# Patient Record
Sex: Female | Born: 1995
Health system: Southern US, Community
[De-identification: ages and names within clinical notes are randomized; demographics above are authoritative.]

## PROBLEM LIST (undated history)

## (undated) DIAGNOSIS — N189 Chronic kidney disease, unspecified: Secondary | ICD-10-CM

## (undated) DIAGNOSIS — B9689 Other specified bacterial agents as the cause of diseases classified elsewhere: Secondary | ICD-10-CM

## (undated) DIAGNOSIS — R55 Syncope and collapse: Secondary | ICD-10-CM

## (undated) HISTORY — PX: NO PAST SURGERIES: SHX2092

---
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
## 2016-02-26 DIAGNOSIS — Z8249 Family history of ischemic heart disease and other diseases of the circulatory system: Secondary | ICD-10-CM | POA: Insufficient documentation

## 2019-09-27 ENCOUNTER — Encounter: Payer: Self-pay | Admitting: Family Medicine

## 2019-09-27 ENCOUNTER — Other Ambulatory Visit: Payer: Self-pay

## 2019-09-27 ENCOUNTER — Ambulatory Visit (INDEPENDENT_AMBULATORY_CARE_PROVIDER_SITE_OTHER): Payer: 59 | Admitting: Family Medicine

## 2019-09-27 VITALS — BP 110/76 | HR 51 | Temp 97.6°F | Resp 14 | Ht 62.0 in | Wt 133.8 lb

## 2019-09-27 DIAGNOSIS — Z3041 Encounter for surveillance of contraceptive pills: Secondary | ICD-10-CM

## 2019-09-27 DIAGNOSIS — Z0000 Encounter for general adult medical examination without abnormal findings: Secondary | ICD-10-CM

## 2019-09-27 DIAGNOSIS — R55 Syncope and collapse: Secondary | ICD-10-CM

## 2019-09-27 DIAGNOSIS — N760 Acute vaginitis: Secondary | ICD-10-CM | POA: Diagnosis not present

## 2019-09-27 DIAGNOSIS — B9689 Other specified bacterial agents as the cause of diseases classified elsewhere: Secondary | ICD-10-CM

## 2019-09-27 HISTORY — DX: Syncope and collapse: R55

## 2019-09-27 HISTORY — DX: Other specified bacterial agents as the cause of diseases classified elsewhere: N76.0

## 2019-09-27 HISTORY — DX: Other specified bacterial agents as the cause of diseases classified elsewhere: B96.89

## 2019-09-27 LAB — CBC WITH DIFFERENTIAL/PLATELET
Basophils Absolute: 0.1 10*3/uL (ref 0.0–0.1)
Basophils Relative: 0.8 % (ref 0.0–3.0)
Eosinophils Absolute: 0.1 10*3/uL (ref 0.0–0.7)
Eosinophils Relative: 1.4 % (ref 0.0–5.0)
HCT: 42 % (ref 36.0–46.0)
Hemoglobin: 14.1 g/dL (ref 12.0–15.0)
Lymphocytes Relative: 30.6 % (ref 12.0–46.0)
Lymphs Abs: 2 10*3/uL (ref 0.7–4.0)
MCHC: 33.5 g/dL (ref 30.0–36.0)
MCV: 97.4 fl (ref 78.0–100.0)
Monocytes Absolute: 0.4 10*3/uL (ref 0.1–1.0)
Monocytes Relative: 6.3 % (ref 3.0–12.0)
Neutro Abs: 4 10*3/uL (ref 1.4–7.7)
Neutrophils Relative %: 60.9 % (ref 43.0–77.0)
Platelets: 266 10*3/uL (ref 150.0–400.0)
RBC: 4.31 Mil/uL (ref 3.87–5.11)
RDW: 13.8 % (ref 11.5–15.5)
WBC: 6.5 10*3/uL (ref 4.0–10.5)

## 2019-09-27 LAB — COMPREHENSIVE METABOLIC PANEL
ALT: 13 U/L (ref 0–35)
AST: 16 U/L (ref 0–37)
Albumin: 4.3 g/dL (ref 3.5–5.2)
Alkaline Phosphatase: 46 U/L (ref 39–117)
BUN: 12 mg/dL (ref 6–23)
CO2: 26 mEq/L (ref 19–32)
Calcium: 9.4 mg/dL (ref 8.4–10.5)
Chloride: 103 mEq/L (ref 96–112)
Creatinine, Ser: 0.84 mg/dL (ref 0.40–1.20)
GFR: 82.17 mL/min (ref >60.00)
Glucose, Bld: 91 mg/dL (ref 70–99)
Potassium: 3.9 mEq/L (ref 3.5–5.1)
Sodium: 138 mEq/L (ref 135–145)
Total Bilirubin: 0.4 mg/dL (ref 0.2–1.2)
Total Protein: 7 g/dL (ref 6.0–8.3)

## 2019-09-27 LAB — LIPID PANEL
Cholesterol: 143 mg/dL (ref 0–200)
HDL: 71.5 mg/dL (ref >39.00)
LDL Cholesterol: 56 mg/dL (ref 0–99)
NonHDL: 71.89
Total CHOL/HDL Ratio: 2
Triglycerides: 80 mg/dL (ref 0.0–149.0)
VLDL: 16 mg/dL (ref 0.0–40.0)

## 2019-09-27 LAB — TSH: TSH: 2.23 u[IU]/mL (ref 0.35–4.50)

## 2019-09-27 NOTE — Patient Instructions (Addendum)
Please return in 12 months for your annual complete physical; please come fasting.  Please sign up for mychart. I have texted you the link.  I will release your lab results to you on your MyChart account with further instructions. Please reply with any questions.    It was a pleasure meeting you today! Thank you for choosing [place] to meet your healthcare needs! I truly look forward to working with you. If you have any questions or concerns, please send me a message via Mychart or call the office at [phone].  Please do these things to maintain good health!   Exercise at least 30-45 minutes a day,  4-5 days a week.   Eat a low-fat diet with lots of fruits and vegetables, up to 7-9 servings per day.  Drink plenty of water daily. Try to drink 8 8oz glasses per day.  Seatbelts can save your life. Always wear your seatbelt.  Place Smoke Detectors on every level of your home and check batteries every year.  Schedule an appointment with an eye doctor for an eye exam every 1-2 years  Safe sex - use condoms to protect yourself from STDs if you could be exposed to these types of infections. Use birth control if you do not want to become pregnant and are sexually active.  Avoid heavy alcohol use. If you drink, keep it to less than 2 drinks/day and not every day.  [place].  Choose someone you trust that could speak for you if you became unable to speak for yourself.  Depression is common in our stressful world.If you're feeling down or losing interest in things you normally enjoy, please come in for a visit.  If anyone is threatening or hurting you, please get help. Physical or Emotional Violence is never OK.

## 2019-09-27 NOTE — Progress Notes (Signed)
Subjective  Chief Complaint  Patient presents with  . Establish Care    Has not had PCP, OB/GYN Eveline Keto saw 06/2019  . Annual Exam    HPI: Carol Walters is a 24 y.o. female who presents to [facility] [place] today for a Female Wellness Visit.   Wellness Visit: annual visit with health maintenance review and exam without Pap   23 year old female here for complete physical and to establish care.  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  She is doing well overall but very busy.  She is also trying to obtain her BSN with online schooling at night.  She is single, intermittently sexually active, has a history of recurrent bacterial vaginosis treated by her GYN.  She is also on oral contraceptives without adverse effects.  She has regular menstrual cycles.  Overall she is healthy and feels well.  She reports a history of near syncope and palpitations and has been worked up by cardiology in the past.  She reports a normal echocardiogram, stress test and EKG.  At times she will have very intermittent symptoms of feeling lightheaded or short of breath but they are rare and have not progressed to anything further.  She says she is never been put on a Holter monitor for any prolonged period of time.  She will be getting her flu shot this week at work.  She believes her Tdap is up-to-date.  Cervical cancer screening is normal and up-to-date.  Assessment  1. Annual physical exam   2. Oral contraceptive use   3. Bacterial vaginosis   4. Near syncope      Plan  Female Wellness Visit:  Age appropriate Health Maintenance and Prevention measures were discussed with patient. Included topics are cancer screening recommendations, ways to keep healthy (see AVS) including dietary and exercise recommendations, regular eye and dental care, use of seat belts, and avoidance of moderate alcohol use and tobacco use.   BMI: discussed patient's BMI and encouraged positive  lifestyle modifications to help get to or maintain a target BMI.  HM needs and immunizations were addressed and ordered. See below for orders. See HM and immunization section for updates.  Routine labs and screening tests ordered including cmp, cbc and lipids where appropriate.  Discussed recommendations regarding Vit D and calcium supplementation (see AVS)  She will follow-up with GYN for contraceptive use and recurrent bacterial vaginosis.  She is asymptomatic at this time.  Near syncope and shortness of breath: Reviewed cardiology notes.  Normal stress echocardiogram, normal EKG.  Likely partially vasovagal in nature.  Recheck lab work today.  Follow-up if symptoms worsen or progress.  Follow up: Return in about 1 year (around 09/26/2020) for complete physical.   Orders Placed This Encounter  Procedures  . CBC with Differential/Platelet  . Comprehensive metabolic panel  . Lipid panel  . HIV Antibody (routine testing w rflx)  . TSH   No orders of the defined types were placed in this encounter.     Lifestyle: Body mass index is 24.47 kg/m. Wt Readings from Last 3 Encounters:  09/27/19 133 lb 12.8 oz (60.7 kg)   Diet: low fat Exercise: frequently,  Need for contraception: Yes, OCP (estrogen/progesterone)  Patient Active Problem List   Diagnosis Date Noted  . Oral contraceptive use 09/27/2019  . Bacterial vaginosis - recurrent BV 09/27/2019  . Near syncope 09/27/2019    Negative cardiac workup    Health Maintenance  Topic Date  Due  . HIV Screening  01/26/2009  . TETANUS/TDAP  01/26/2013  . PAP-Cervical Cytology Screening  01/26/2015  . PAP SMEAR-Modifier  09/26/2021  . INFLUENZA VACCINE  Completed   Immunization History  Administered Date(s) Administered  . Influenza,inj,Quad PF,6+ Mos 10/13/2018  . PPD Test 03/07/2016, 02/22/2017   We updated and reviewed the patient's past history in detail and it is documented below. Allergies: Patient is allergic to  amoxicillin. Past Medical History Patient  has a past medical history of Bacterial vaginosis - recurrent BV (09/27/2019) and Near syncope (09/27/2019). Past Surgical History Patient  has no past surgical history on file. Family History: Patient family history is not on file. Social History:  Patient  reports that she has never smoked. She has never used smokeless tobacco.  Review of Systems: Constitutional: negative for fever or malaise Ophthalmic: negative for photophobia, double vision or loss of vision Cardiovascular: negative for chest pain, dyspnea on exertion, or new LE swelling Respiratory: negative for  persistent cough, positive for intermittent shortness of breath. Gastrointestinal: negative for abdominal pain, change in bowel habits or melena Genitourinary: negative for dysuria or gross hematuria, no abnormal uterine bleeding or disharge Musculoskeletal: negative for new gait disturbance or muscular weakness Integumentary: negative for new or persistent rashes, no breast lumps Neurological: negative for TIA or stroke symptoms Psychiatric: negative for SI or delusions Allergic/Immunologic: negative for hives Patient Care Team    Relationship Specialty Notifications Start End  Leamon Arnt, MD PCP - General Family Medicine  09/27/19   Elesa Massed, NP Nurse Practitioner Obstetrics and Gynecology  09/27/19     Objective  Vitals: BP 110/76   Pulse (!) 51   Temp 97.6 F (36.4 C) (Tympanic)   Resp 14   Ht 5\' 2"  (1.575 m)   Wt 133 lb 12.8 oz (60.7 kg)   LMP 09/18/2019   SpO2 98%   BMI 24.47 kg/m  General:  Well developed, well nourished, no acute distress  Psych:  Alert and orientedx3,normal mood and affect HEENT:  Normocephalic, atraumatic, non-icteric sclera, PERRL, oropharynx is clear without mass or exudate, supple neck without adenopathy, mass or thyromegaly Cardiovascular:  Normal S1, S2, RRR without gallop, rub or murmur, nondisplaced PMI Respiratory:  Good  breath sounds bilaterally, CTAB with normal respiratory effort Gastrointestinal: normal bowel sounds, soft, non-tender, no noted masses. No HSM MSK: no deformities, contusions. Joints are without erythema or swelling. Spine and CVA region are nontender Skin:  Warm, no rashes or suspicious lesions noted Neurologic:    Mental status is normal. CN 2-11 are normal. Gross motor and sensory exams are normal. Normal gait. No tremor    Commons side effects, risks, benefits, and alternatives for medications and treatment plan prescribed today were discussed, and the patient expressed understanding of the given instructions. Patient is instructed to call or message via MyChart if he/she has any questions or concerns regarding our treatment plan. No barriers to understanding were identified. We discussed Red Flag symptoms and signs in detail. Patient expressed understanding regarding what to do in case of urgent or emergency type symptoms.   Medication list was reconciled, printed and provided to the patient in AVS. Patient instructions and summary information was reviewed with the patient as documented in the AVS. This note was prepared with assistance of Dragon voice recognition software. Occasional wrong-word or sound-a-like substitutions may have occurred due to the inherent limitations of voice recognition software

## 2019-09-28 ENCOUNTER — Encounter: Payer: Self-pay | Admitting: *Deleted

## 2019-09-28 ENCOUNTER — Encounter: Payer: Self-pay | Admitting: Family Medicine

## 2019-09-28 LAB — HIV ANTIBODY (ROUTINE TESTING W REFLEX): HIV 1&2 Ab, 4th Generation: NONREACTIVE

## 2019-09-28 NOTE — Progress Notes (Signed)
Lab results mailed to patient in letter. Normal results. No action / follow up needed on these results.  

## 2020-07-17 ENCOUNTER — Other Ambulatory Visit: Payer: Self-pay

## 2020-07-17 ENCOUNTER — Emergency Department (INDEPENDENT_AMBULATORY_CARE_PROVIDER_SITE_OTHER): Payer: No Typology Code available for payment source

## 2020-07-17 ENCOUNTER — Emergency Department
Admission: RE | Admit: 2020-07-17 | Discharge: 2020-07-17 | Disposition: A | Discharge status: Home / Self Care | Payer: No Typology Code available for payment source | Source: Ambulatory Visit | Attending: Family Medicine | Admitting: Family Medicine

## 2020-07-17 ENCOUNTER — Emergency Department: Admit: 2020-07-17 | Discharge status: Absent without leave | Payer: Self-pay

## 2020-07-17 VITALS — BP 115/85 | HR 89 | Temp 99.4°F | Resp 16

## 2020-07-17 DIAGNOSIS — R1084 Generalized abdominal pain: Secondary | ICD-10-CM | POA: Diagnosis not present

## 2020-07-17 DIAGNOSIS — J209 Acute bronchitis, unspecified: Secondary | ICD-10-CM | POA: Diagnosis not present

## 2020-07-17 DIAGNOSIS — K5900 Constipation, unspecified: Secondary | ICD-10-CM

## 2020-07-17 DIAGNOSIS — R109 Unspecified abdominal pain: Secondary | ICD-10-CM | POA: Diagnosis not present

## 2020-07-17 LAB — POCT URINALYSIS DIP (MANUAL ENTRY)
Bilirubin, UA: NEGATIVE
Blood, UA: NEGATIVE
Glucose, UA: NEGATIVE mg/dL
Ketones, POC UA: NEGATIVE mg/dL
Leukocytes, UA: NEGATIVE
Nitrite, UA: NEGATIVE
Protein Ur, POC: NEGATIVE mg/dL
Spec Grav, UA: 1.025 (ref 1.010–1.025)
Urobilinogen, UA: 0.2 E.U./dL
pH, UA: 7 (ref 5.0–8.0)

## 2020-07-17 LAB — POCT CBC W AUTO DIFF (K'VILLE URGENT CARE)

## 2020-07-17 LAB — POCT URINE PREGNANCY: Preg Test, Ur: NEGATIVE

## 2020-07-17 MED ORDER — AZITHROMYCIN 250 MG PO TABS: ORAL_TABLET | ORAL | 0 refills | Status: DC

## 2020-07-17 NOTE — ED Triage Notes (Signed)
Patient presents to Urgent Care with complaints of generalized abdominal pain since late last night. Patient reports sometimes the pain is upper/centralized and then feels like it radiates to the rest of her abdomen, pain is intermittent, has tried tums but it does not seem to help. Pt is having regular BMs, had one earlier today.  Patient saw her OBGYN yesterday, states no pain at that time. Pt also has a persistent dry cough, worse at night.

## 2020-07-17 NOTE — ED Provider Notes (Signed)
[facility]    CSN: [id number] Arrival date & time: 07/17/20  1831      History   Chief Complaint Chief Complaint  Patient presents with  . Appointment    7:00  . Abdominal Pain  . Cough    HPI Carol Walters is a 24 y.o. female.   Patient complains of a non productive cough for one week, poorly controlled with Delsym. She denies pleuritic pain, shortness of breath, and fevers, chills, and sweats.  Yesterday she developed intermittent abdominal cramps/bloating.  The pain is in the upper central part of her abdomen periodically involving her whole abdomen.  She denies nausea/vomiting and Tums have not been helpful.  Her bowel movements have been normal.     The history is provided by the patient.    Past Medical History:  Diagnosis Date  . Bacterial vaginosis - recurrent BV 09/27/2019  . Near syncope 09/27/2019   Negative cardiac workup    Patient Active Problem List   Diagnosis Date Noted  . Oral contraceptive use 09/27/2019  . Bacterial vaginosis - recurrent BV 09/27/2019  . Near syncope 09/27/2019    History reviewed. No pertinent surgical history.  OB History   No obstetric history on file.      Home Medications    Prior to Admission medications   Medication Sig Start Date End Date Taking? Authorizing Provider  azithromycin (ZITHROMAX Z-PAK) 250 MG tablet Take 2 tabs today; then begin one tab once daily for 4 more days. 07/17/20   Lattie Haw, MD  norgestimate-ethinyl estradiol (SPRINTEC 28) 0.25-35 MG-MCG tablet Sprintec (28) 0.25 mg-35 mcg tablet  TAKE 1 TABLET BY MOUTH EVERY DAY AS DIRECTED    [provider]    Family History Family History  Problem Relation Age of Onset  . Healthy Mother   . Diabetes Maternal Grandmother   . Hearing loss Maternal Grandmother   . Cancer Maternal Grandfather   . Hearing loss Maternal Grandfather   . High blood pressure Maternal Grandfather   . Stroke Paternal Grandmother   . High blood  pressure Paternal Grandfather   . Stroke Paternal Grandfather   . Healthy Father     Social History Social History   Tobacco Use  . Smoking status: Never Smoker  . Smokeless tobacco: Never Used  Substance Use Topics  . Alcohol use: Yes    Comment: occ  . Drug use: Not on file     Allergies   Amoxicillin   Review of Systems Review of Systems No sore throat + cough No pleuritic pain No wheezing No nasal congestion No post-nasal drainage No sinus pain/pressure No itchy/red eyes No earache No hemoptysis No SOB No fever/chills No nausea No vomiting + abdominal pain No diarrhea No urinary symptoms No skin rash No fatigue No myalgias No headache   Physical Exam Triage Vital Signs ED Triage Vitals  Enc Vitals Group     BP 07/17/20 1850 115/85     Pulse Rate 07/17/20 1850 89     Resp 07/17/20 1850 16     Temp 07/17/20 1850 99.4 F (37.4 C)     Temp Source 07/17/20 1850 Oral     SpO2 07/17/20 1850 99 %     Weight --      Height --      Head Circumference --      Peak Flow --      Pain Score 07/17/20 1847 4     Pain Loc --  Pain Edu? --      Excl. in GC? --    No data found.  Updated Vital Signs BP 115/85 (BP Location: Right Arm)   Pulse 89   Temp 99.4 F (37.4 C) (Oral)   Resp 16   SpO2 99%   Visual Acuity Right Eye Distance:   Left Eye Distance:   Bilateral Distance:    Right Eye Near:   Left Eye Near:    Bilateral Near:     Physical Exam Vitals and nursing note reviewed.  Constitutional:      General: She is not in acute distress.    Appearance: She is not ill-appearing.  HENT:     Head: Normocephalic.     Right Ear: Tympanic membrane normal.     Left Ear: Tympanic membrane normal.     Nose: Nose normal.     Mouth/Throat:     Pharynx: Oropharynx is clear.  Eyes:     Extraocular Movements: Extraocular movements intact.     Conjunctiva/sclera: Conjunctivae normal.     Pupils: Pupils are equal, round, and reactive to light.    Cardiovascular:     Rate and Rhythm: Normal rate.     Heart sounds: Normal heart sounds.  Pulmonary:     Breath sounds: Normal breath sounds.  Abdominal:     General: Bowel sounds are normal.     Palpations: Abdomen is soft.     Tenderness: There is abdominal tenderness in the right lower quadrant and periumbilical area.       Comments: Abdomen has vague diffuse tenderness to palpation most pronounced in there right lower quadrant.  Musculoskeletal:        General: No tenderness.     Cervical back: Neck supple.     Right lower leg: No edema.     Left lower leg: No edema.  Lymphadenopathy:     Cervical: No cervical adenopathy.  Skin:    General: Skin is warm and dry.     Findings: No rash.  Neurological:     Mental Status: She is alert and oriented to person, place, and time.      UC Treatments / Results  Labs (all labs ordered are listed, but only abnormal results are displayed) Labs Reviewed  POCT URINALYSIS DIP (MANUAL ENTRY) - Abnormal; Notable for the following components:      Result Value   Clarity, UA cloudy (*)    All other components within normal limits  TSH  POCT CBC W AUTO DIFF ([facility]):  WBC 11.1; LY 12.3; MO 8.5; GR 79.2; Hgb 15.2; Platelets 315   POCT URINE PREGNANCY negative    EKG   Radiology DG Abd 2 Views  Result Date: 07/17/2020 CLINICAL DATA:  Generalized abdominal pain for 2 days EXAM: ABDOMEN - 2 VIEW COMPARISON:  02/05/2010 FINDINGS: Mild S-shaped scoliosis of the thoracolumbar spine is noted. Scattered large and small bowel gas is noted. Mild retained fecal material is noted consistent with mild constipation. No obstructive changes are seen. No calcifications are noted. IMPRESSION: Mild constipation. Electronically Signed   By: Alcide Clever M.D.   On: 07/17/2020 20:16    Procedures Procedures (including critical care time)  Medications Ordered in UC Medications - No data to display  Initial Impression / Assessment and  Plan / UC Course  I have reviewed the triage vital signs and the nursing notes.  Pertinent labs & imaging results that were available during my care of the patient were reviewed by  me and considered in my medical decision making (see chart for details).    Suspect mild leukocytosis resulting from respiratory condition.  Begin empiric Z-pak. Abdominal pain resulting from constipation.  Will check TSH. Followup with Family Doctor if not improved in one week.    Final Clinical Impressions(s) / UC Diagnoses   Final diagnoses:  Generalized abdominal pain  Abdominal pain  Constipation, unspecified constipation type  Acute bronchitis, unspecified organism     Discharge Instructions     Recommend taking daily Miralax, approximately 1/2 to one capful mixed in 4 to 8 ounces of water until bowel movements are regular.    May take Delsym Cough Suppressant ("12 Hour Cough Relief") at bedtime for nighttime cough.      ED Prescriptions    Medication Sig Dispense Auth. Provider   azithromycin (ZITHROMAX Z-PAK) 250 MG tablet Take 2 tabs today; then begin one tab once daily for 4 more days. 6 tablet Lattie Haw, MD        Lattie Haw, MD 07/19/20 [phone]

## 2020-07-17 NOTE — Discharge Instructions (Addendum)
Recommend taking daily Miralax, approximately 1/2 to one capful mixed in 4 to 8 ounces of water until bowel movements are regular.    May take Delsym Cough Suppressant ("12 Hour Cough Relief") at bedtime for nighttime cough.

## 2020-07-18 LAB — TSH: TSH: 1.37 mIU/L

## 2020-07-18 MED FILL — AZITHROMYCIN 250 MG TABS: 250 | 5 days supply | Qty: 6 | Fill #0

## 2020-08-14 IMAGING — DX DG ABDOMEN 2V
2 series · 2 of 2 positions shown · non-contrast
Comparison: 02/05/2010

CLINICAL DATA: Generalized abdominal pain for 2 days

EXAM:
ABDOMEN - 2 VIEW

[abdomen erect]
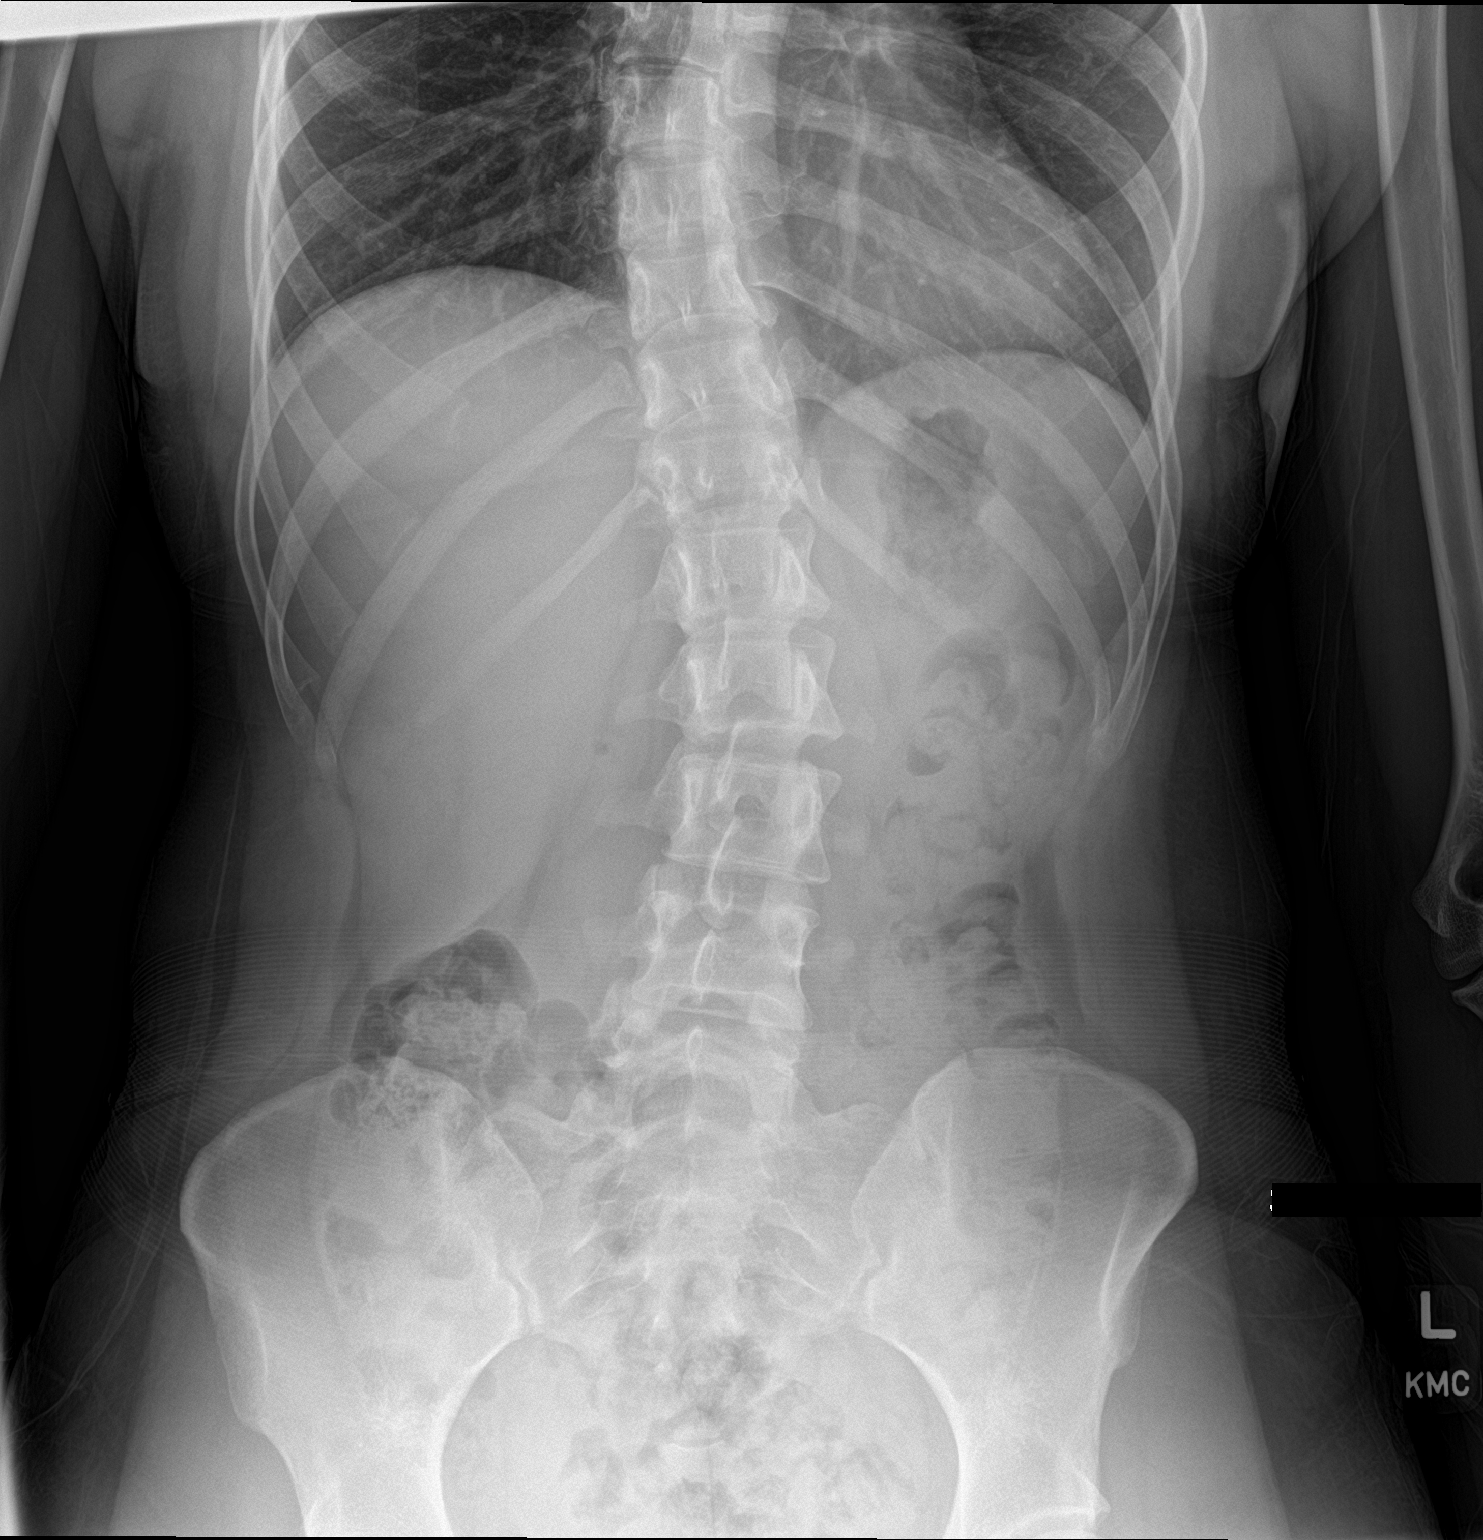

[abdomen supine]
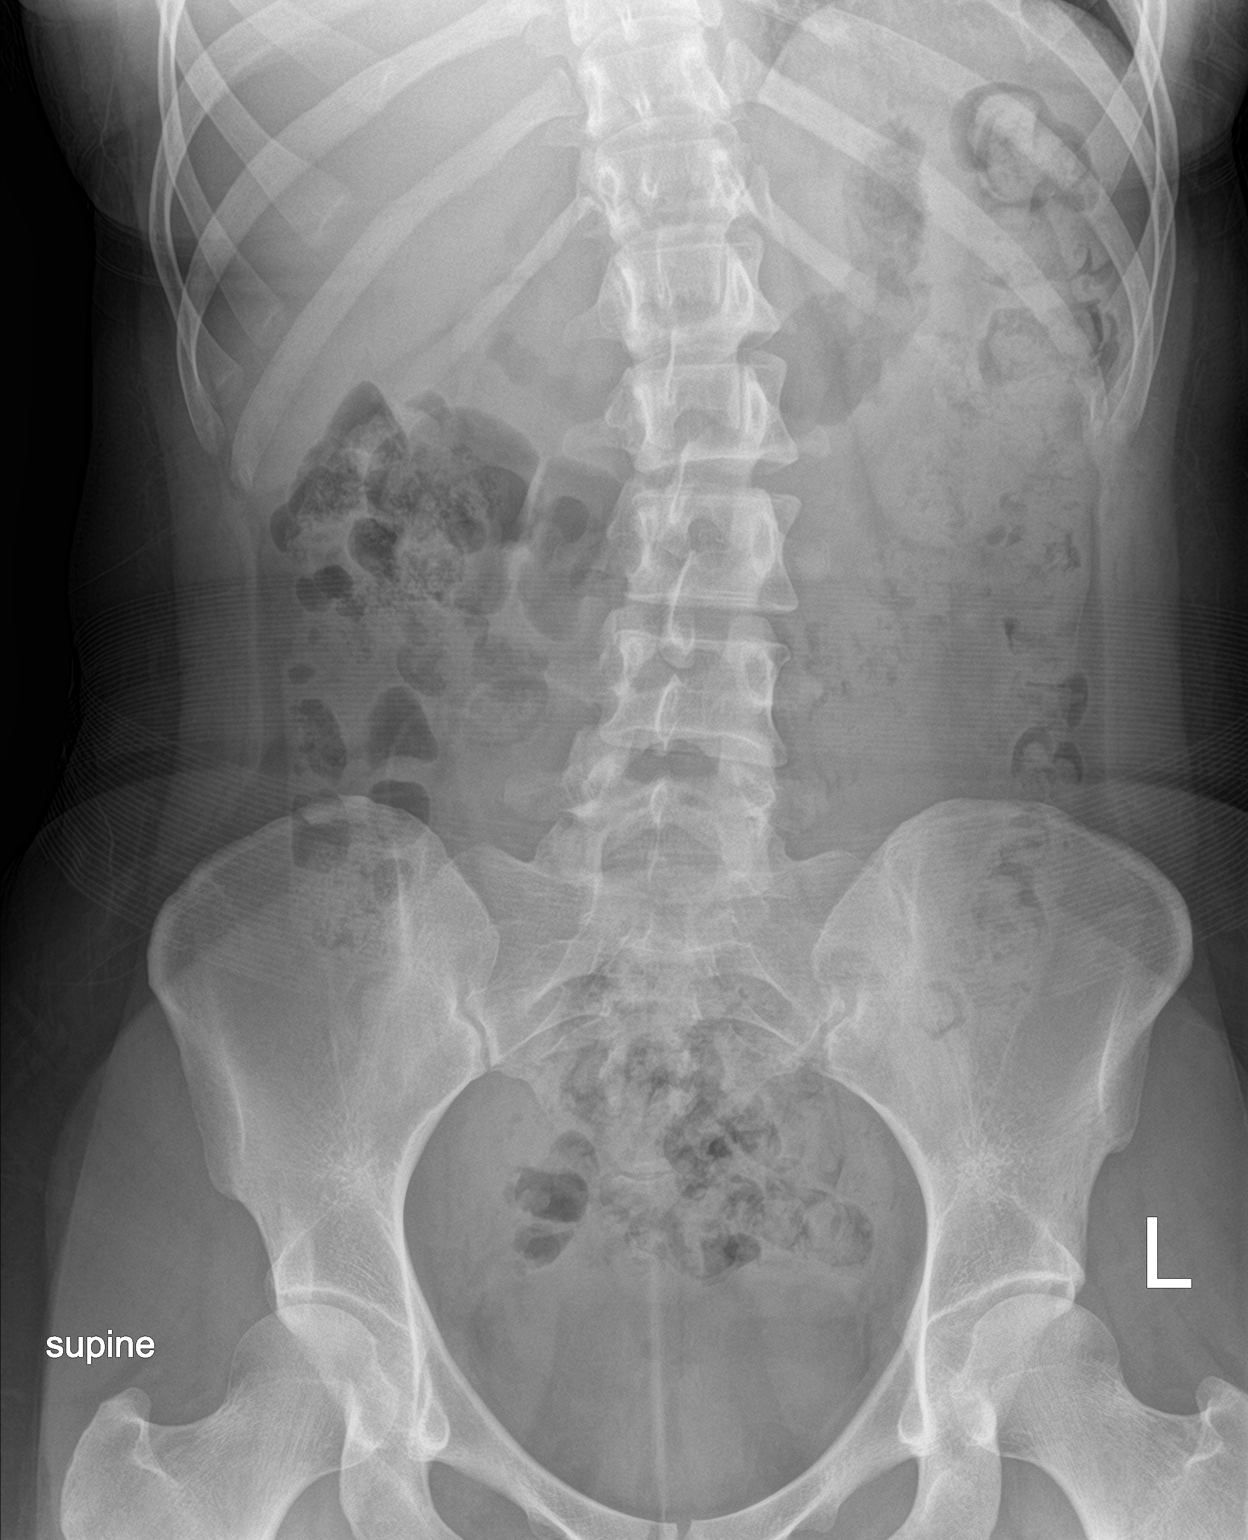

[2 of 2 positions shown; findings below may reference images not displayed]

FINDINGS: Mild S-shaped scoliosis of the thoracolumbar spine is noted.
Scattered large and small bowel gas is noted. Mild retained fecal
material is noted consistent with mild constipation. No obstructive
changes are seen. No calcifications are noted.
IMPRESSION: Mild constipation.

## 2021-07-23 ENCOUNTER — Emergency Department (HOSPITAL_BASED_OUTPATIENT_CLINIC_OR_DEPARTMENT_OTHER)
Admission: EM | Admit: 2021-07-23 | Discharge: 2021-07-23 | Disposition: A | Discharge status: Home / Self Care | Payer: No Typology Code available for payment source | Attending: Emergency Medicine | Admitting: Emergency Medicine

## 2021-07-23 ENCOUNTER — Other Ambulatory Visit: Payer: Self-pay

## 2021-07-23 ENCOUNTER — Emergency Department (HOSPITAL_BASED_OUTPATIENT_CLINIC_OR_DEPARTMENT_OTHER): Payer: No Typology Code available for payment source

## 2021-07-23 ENCOUNTER — Other Ambulatory Visit (HOSPITAL_COMMUNITY): Payer: Self-pay

## 2021-07-23 ENCOUNTER — Encounter (HOSPITAL_BASED_OUTPATIENT_CLINIC_OR_DEPARTMENT_OTHER): Payer: Self-pay | Admitting: Emergency Medicine

## 2021-07-23 DIAGNOSIS — R1030 Lower abdominal pain, unspecified: Secondary | ICD-10-CM | POA: Diagnosis present

## 2021-07-23 DIAGNOSIS — R1031 Right lower quadrant pain: Secondary | ICD-10-CM | POA: Insufficient documentation

## 2021-07-23 LAB — URINALYSIS, ROUTINE W REFLEX MICROSCOPIC
Bilirubin Urine: NEGATIVE
Glucose, UA: NEGATIVE mg/dL
Hgb urine dipstick: NEGATIVE
Ketones, ur: NEGATIVE mg/dL
Leukocytes,Ua: NEGATIVE
Nitrite: NEGATIVE
Specific Gravity, Urine: 1.024 (ref 1.005–1.030)
pH: 7 (ref 5.0–8.0)

## 2021-07-23 LAB — WET PREP, GENITAL
Clue Cells Wet Prep HPF POC: NONE SEEN
Sperm: NONE SEEN
Trich, Wet Prep: NONE SEEN
Yeast Wet Prep HPF POC: NONE SEEN

## 2021-07-23 LAB — COMPREHENSIVE METABOLIC PANEL
ALT: 13 U/L (ref 0–44)
AST: 15 U/L (ref 15–41)
Albumin: 4.2 g/dL (ref 3.5–5.0)
Alkaline Phosphatase: 43 U/L (ref 38–126)
Anion gap: 11 (ref 5–15)
BUN: 15 mg/dL (ref 6–20)
CO2: 28 mmol/L (ref 22–32)
Calcium: 9.6 mg/dL (ref 8.9–10.3)
Chloride: 101 mmol/L (ref 98–111)
Creatinine, Ser: 0.84 mg/dL (ref 0.44–1.00)
GFR, Estimated: >60 mL/min (ref >60)
Glucose, Bld: 81 mg/dL (ref 70–99)
Potassium: 3.4 mmol/L — ABNORMAL LOW (ref 3.5–5.1)
Sodium: 140 mmol/L (ref 135–145)
Total Bilirubin: 0.4 mg/dL (ref 0.3–1.2)
Total Protein: 7.6 g/dL (ref 6.5–8.1)

## 2021-07-23 LAB — PREGNANCY, URINE: Preg Test, Ur: NEGATIVE

## 2021-07-23 LAB — CBC
HCT: 44.5 % (ref 36.0–46.0)
Hemoglobin: 15 g/dL (ref 12.0–15.0)
MCH: 31.8 pg (ref 26.0–34.0)
MCHC: 33.7 g/dL (ref 30.0–36.0)
MCV: 94.3 fL (ref 80.0–100.0)
Platelets: 308 10*3/uL (ref 150–400)
RBC: 4.72 MIL/uL (ref 3.87–5.11)
RDW: 13.1 % (ref 11.5–15.5)
WBC: 7.9 10*3/uL (ref 4.0–10.5)
nRBC: 0 % (ref 0.0–0.2)

## 2021-07-23 LAB — LIPASE, BLOOD: Lipase: 18 U/L (ref 11–51)

## 2021-07-23 MED ORDER — PANTOPRAZOLE SODIUM 20 MG PO TBEC
40.0000 mg | DELAYED_RELEASE_TABLET | Freq: Every day | ORAL | 0 refills | Status: DC
  Filled 2021-07-23: qty 28, 14d supply, fill #0

## 2021-07-23 MED ORDER — ONDANSETRON 4 MG PO TBDP
4.0000 mg | ORAL_TABLET | Freq: Three times a day (TID) | ORAL | 0 refills | Status: AC | PRN
  Filled 2021-07-23: qty 10, 4d supply, fill #0

## 2021-07-23 MED ORDER — DICYCLOMINE HCL 20 MG PO TABS
20.0000 mg | ORAL_TABLET | Freq: Three times a day (TID) | ORAL | 0 refills | Status: AC | PRN
  Filled 2021-07-23: qty 20, 7d supply, fill #0

## 2021-07-23 MED ORDER — IOHEXOL 300 MG/ML  SOLN
80.0000 mL | Freq: Once | INTRAMUSCULAR | Status: AC | PRN
  Administered 2021-07-23: 80 mL via INTRAVENOUS

## 2021-07-23 NOTE — ED Notes (Signed)
ED Provider at bedside. 

## 2021-07-23 NOTE — ED Triage Notes (Signed)
Pt complains of generalized abdominal pain that started on the 28th. Pain is intermittent. Pt is unsure of cause.  Pt was born with one kidney. Pt currently complains of 2 out of 10 but stated that when she first got her she was a 5 out of 10.

## 2021-07-23 NOTE — ED Provider Notes (Signed)
[facility] EMERGENCY DEPT Provider Note   CSN: [id number] Arrival date & time: 07/23/21  0908     History Chief Complaint  Patient presents with   Abdominal Pain    Carol Walters is a 25 y.o. female.  HPI     25yo female presents with concern for abdominal pain.  Reports for about one week has been having abdominal pain. 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  She ate sushi the first night and began to have some pain that she thought was due to the food.  The pain continued but was mild initially. Did have some etoh at the wedding, pain has now continued since that time, epigastric pain with radiating to the RUQ and RLQ. It waxes and wanes but is not associated with anything in particular, not changed by eating or drinking. Goes from 3/10 to 7/10 in severity. Worsened last night and more persistent so came to ED.  No dysuria, vaginal discharge, fevers, chills, hx of abdominal surgery. Has irregular menses that were wupposed to start Monday but not unusual to be irregular and took neg pregnancy test yesterday. Sexually active with partner, on OCP but not always using condoms. Has had abdominal pain before diagnosed as constipation but had a BM yesterday on the looser side.   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   Past Medical History:  Diagnosis Date   Bacterial vaginosis - recurrent BV 09/27/2019   Near syncope 09/27/2019   Negative cardiac workup    Patient Active Problem List   Diagnosis Date Noted   Oral contraceptive use 09/27/2019   Bacterial vaginosis - recurrent BV 09/27/2019   Near syncope 09/27/2019    History reviewed. No pertinent surgical history.   OB History   No obstetric history on file.     Family History  Problem Relation Age of Onset   Healthy Mother    Diabetes Maternal Grandmother    Hearing loss Maternal Grandmother    Cancer Maternal Grandfather    Hearing loss Maternal Grandfather    High blood pressure Maternal Grandfather     Stroke Paternal Grandmother    High blood pressure Paternal Grandfather    Stroke Paternal Grandfather    Healthy Father     Social History   Tobacco Use   Smoking status: Never   Smokeless tobacco: Never  Substance Use Topics   Alcohol use: Yes    Comment: occ    Home Medications Prior to Admission medications   Medication Sig Start Date End Date Taking? Authorizing Provider  azithromycin (ZITHROMAX Z-PAK) 250 MG tablet Take 2 tabs today; then begin one tab once daily for 4 more days. 07/17/20   Lattie Haw, MD  norgestimate-ethinyl estradiol (SPRINTEC 28) 0.25-35 MG-MCG tablet Sprintec (28) 0.25 mg-35 mcg tablet  TAKE 1 TABLET BY MOUTH EVERY DAY AS DIRECTED    [provider]    Allergies    Amoxicillin  Review of Systems   Review of Systems  Constitutional:  Negative for fever.  Eyes:  Negative for visual disturbance.  Respiratory:  Negative for cough and shortness of breath.   Cardiovascular:  Negative for chest pain.  Gastrointestinal:  Positive for abdominal pain and nausea. Negative for constipation, diarrhea and vomiting.  Genitourinary:  Negative for difficulty urinating.  Musculoskeletal:  Negative for back pain and neck pain.  Skin:  Negative for rash.  Neurological:  Negative for syncope and headaches.   Physical Exam Updated Vital Signs BP  131/89 (BP Location: Right Arm)   Pulse 74   Temp 98.6 F (37 C) (Oral)   Resp 16   Ht 5\' 2"  (1.575 m)   Wt 56.7 kg   LMP 06/24/2021 (Approximate)   SpO2 100%   BMI 22.86 kg/m   Physical Exam Vitals and nursing note reviewed.  Constitutional:      General: She is not in acute distress.    Appearance: She is well-developed. She is not diaphoretic.  HENT:     Head: Normocephalic and atraumatic.  Eyes:     Conjunctiva/sclera: Conjunctivae normal.  Cardiovascular:     Rate and Rhythm: Normal rate and regular rhythm.  Pulmonary:     Effort: Pulmonary effort is normal. No respiratory distress.   Abdominal:     General: There is no distension.     Palpations: Abdomen is soft.     Tenderness: There is abdominal tenderness in the right lower quadrant and suprapubic area. There is no right CVA tenderness, left CVA tenderness or guarding. Positive signs include McBurney's sign. Negative signs include Murphy's sign.  Genitourinary:    Labia:        Right: No tenderness.        Left: No tenderness.      Cervix: No cervical motion tenderness or discharge.     Uterus: Not tender.      Adnexa:        Right: No tenderness.         Left: No tenderness.    Musculoskeletal:        General: No tenderness.     Cervical back: Normal range of motion.  Skin:    General: Skin is warm and dry.     Findings: No erythema or rash.  Neurological:     Mental Status: She is alert and oriented to person, place, and time.    ED Results / Procedures / Treatments   Labs (all labs ordered are listed, but only abnormal results are displayed) Labs Reviewed  COMPREHENSIVE METABOLIC PANEL - Abnormal; Notable for the following components:      Result Value   Potassium 3.4 (*)    All other components within normal limits  URINALYSIS, ROUTINE W REFLEX MICROSCOPIC - Abnormal; Notable for the following components:   Protein, ur TRACE (*)    All other components within normal limits  LIPASE, BLOOD  CBC  PREGNANCY, URINE    EKG None  Radiology No results found.  Procedures Procedures   Medications Ordered in ED Medications  iohexol (OMNIPAQUE) 300 MG/ML solution 80 mL (has no administration in time range)    ED Course  I have reviewed the triage vital signs and the nursing notes.  Pertinent labs & imaging results that were available during my care of the patient were reviewed by me and considered in my medical decision making (see chart for details).    MDM Rules/Calculators/A&P                            25yo female presents with concern for abdominal pain. DDx includes appendicitis,  pancreatitis, cholecystitis, pyelonephritis, nephrolithiasis, diverticulitis, PID, ovarian torsion, ectopic pregnancy, and tuboovarian abscess   Labs show no sign of pancreatitis or hepatitis.  Pregnancy test negative. UA without infection.  CT abdomen pelvis shows no acute abnormalities. Discussed adrenal lesion and need for outpatient follow up imaging.  No sing of PID, TOA, torsion by history and exam. Will treat for  possible gastritis and also give bentyl for pain. Patient discharged in stable condition with understanding of reasons to return.     Final Clinical Impression(s) / ED Diagnoses Final diagnoses:  Lower abdominal pain    Rx / DC Orders ED Discharge Orders     None        Alvira Monday, MD 07/23/21 2148

## 2021-07-24 LAB — GC/CHLAMYDIA PROBE AMP (~~LOC~~) NOT AT ARMC
Chlamydia: NEGATIVE
Comment: NEGATIVE
Comment: NORMAL
Neisseria Gonorrhea: NEGATIVE

## 2021-07-28 ENCOUNTER — Other Ambulatory Visit: Payer: Self-pay

## 2021-07-28 ENCOUNTER — Ambulatory Visit (INDEPENDENT_AMBULATORY_CARE_PROVIDER_SITE_OTHER): Payer: No Typology Code available for payment source | Admitting: Family Medicine

## 2021-07-28 ENCOUNTER — Encounter: Payer: Self-pay | Admitting: Family Medicine

## 2021-07-28 VITALS — BP 100/69 | HR 61 | Temp 98.0°F | Ht 62.0 in | Wt 126.2 lb

## 2021-07-28 DIAGNOSIS — R1013 Epigastric pain: Secondary | ICD-10-CM

## 2021-07-28 DIAGNOSIS — E278 Other specified disorders of adrenal gland: Secondary | ICD-10-CM | POA: Diagnosis not present

## 2021-07-28 DIAGNOSIS — N261 Atrophy of kidney (terminal): Secondary | ICD-10-CM | POA: Diagnosis not present

## 2021-07-28 MED ORDER — PANTOPRAZOLE SODIUM 20 MG PO TBEC: 20.0000 mg | DELAYED_RELEASE_TABLET | Freq: Every day | ORAL | 1 refills | Status: AC

## 2021-07-28 NOTE — Progress Notes (Signed)
Subjective  CC:  Chief Complaint  Patient presents with   Abdominal Pain    Seen at [facility] ED 8/3, possible adrenal lesion or gastritis. Given Bentyl and protonix- has helped the pain some and told to f/u with PCP for more outpatient imaging  States that the pain is not as bad as it was     HPI: Carol Walters is a 25 y.o. female who presents to the office today to address the problems listed above in the chief complaint. 25 year old healthy female on birth control pills presents for follow-up after experiencing abdominal pain.  I reviewed emergency room records including lab work, imaging studies and notes.  She was seen on August 3 for persistent abdominal pain for the last 2 days.  She reports that her symptoms started while she was away at a wedding.  She describes upper abdominal pain worse after eating that radiated to the right lower abdomen.  She denies fevers, chills, nausea or vomiting.  Bowel movements have been normal.  She did eat differently due to being away on travel and had some alcohol which is very rare for her.  No history of gastritis, NSAID use.  Prior to that had felt well.  She is started on Protonix 40 mg nightly and this has helped her symptoms.  Now having only minimal pain.  No history of IBS. CT scan incidentally found an adrenal mass on the left.  It has benign features and is 1.9 cm.  She has no symptoms of Cushing disease, pheochromocytoma and no history of hyperkalemia. Renal atrophy and functioning right kidney was also visualized.  Assessment  1. Midepigastric pain   2. Renal atrophy, right   3. Adrenal mass, right (HCC)      Plan  Midepigastric pain: Possible acute gastritis improving with PPI.  Discussed differential.  Lab work was all reviewed and was unremarkable.  Recommend continuation of PPI for 6 to 12 weeks.  Will decrease dose back to 20 mg daily after a 14-day course of 40 daily.  If symptoms recur or she has flares, can then start work-up  for biliary tract disease or EGD if needed.  Patient stands and agrees with care plan. Adrenal incidentaloma: No clinical features of hormone secreting adrenal mass.  We will follow clinically.  Repeat CT scan in 12 months to ensure stability.  Nonfunctioning right kidney: Likely vascular incident early in life.  Noted.  Avoid nephrotoxins.  Follow up: Overdue for complete physical.  Schedule at her convenience Visit date not found  Orders Placed This Encounter  Procedures   CT Abdomen Pelvis W Contrast   Meds ordered this encounter  Medications   pantoprazole (PROTONIX) 20 MG tablet    Sig: Take 1 tablet (20 mg total) by mouth daily.    Dispense:  30 tablet    Refill:  1      I reviewed the patients updated PMH, FH, and SocHx.    Patient Active Problem List   Diagnosis Date Noted   Renal atrophy, right 07/28/2021   Adrenal mass, right (HCC) 07/28/2021   Oral contraceptive use 09/27/2019   Bacterial vaginosis - recurrent BV 09/27/2019   Near syncope 09/27/2019   Current Meds  Medication Sig   dicyclomine (BENTYL) 20 MG tablet Take 1 tablet (20 mg total) by mouth 3 (three) times daily as needed for spasms (abdominal pain).   norgestimate-ethinyl estradiol (ORTHO-CYCLEN) 0.25-35 MG-MCG tablet Sprintec (28) 0.25 mg-35 mcg tablet  TAKE 1 TABLET BY MOUTH  EVERY DAY AS DIRECTED   ondansetron (ZOFRAN ODT) 4 MG disintegrating tablet Take 1 tablet (4 mg total) by mouth every 8 (eight) hours as needed for nausea or vomiting.   Turmeric (QC TUMERIC COMPLEX PO) Take by mouth.   [DISCONTINUED] pantoprazole (PROTONIX) 20 MG tablet Take 2 tablets (40 mg total) by mouth daily for 14 days.    Allergies: Patient is allergic to amoxicillin. Family History: Patient family history includes Cancer in her maternal grandfather; Diabetes in her maternal grandmother; Healthy in her father and mother; Hearing loss in her maternal grandfather and maternal grandmother; High blood pressure in her  maternal grandfather and paternal grandfather; Stroke in her paternal grandfather and paternal grandmother. Social History:  Patient  reports that she has never smoked. She has never used smokeless tobacco. She reports current alcohol use. She reports that she does not use drugs.  Review of Systems: Constitutional: Negative for fever malaise or anorexia Cardiovascular: negative for chest pain Respiratory: negative for SOB or persistent cough Gastrointestinal: negative for nausea, vomiting or diarrhea  Objective  Vitals: BP 100/69   Pulse 61   Temp 98 F (36.7 C) (Temporal)   Ht 5\' 2"  (1.575 m)   Wt 126 lb 3.2 oz (57.2 kg)   LMP 07/23/2021 (Exact Date)   SpO2 98%   BMI 23.08 kg/m  General: no acute distress , A&Ox3 HEENT: PEERL, conjunctiva normal, neck is supple Cardiovascular:  RRR without murmur or gallop.  Respiratory:  Good breath sounds bilaterally, CTAB with normal respiratory effort Gastrointestinal: soft, flat abdomen, normal active bowel sounds, no palpable masses, no hepatosplenomegaly, no appreciated hernias Minimal midepigastric tenderness without rebound or guarding Skin:  Warm, no rashes  No visits with results within 1 Day(s) from this visit.  Latest known visit with results is:  Admission on 07/23/2021, Discharged on 07/23/2021  Component Date Value Ref Range Status   Lipase 07/23/2021 18  11 - 51 U/L Final   Sodium 07/23/2021 140  135 - 145 mmol/L Final   Potassium 07/23/2021 3.4 (A) 3.5 - 5.1 mmol/L Final   Chloride 07/23/2021 101  98 - 111 mmol/L Final   CO2 07/23/2021 28  22 - 32 mmol/L Final   Glucose, Bld 07/23/2021 81  70 - 99 mg/dL Final   BUN 09/22/2021 15  6 - 20 mg/dL Final   Creatinine, Ser 07/23/2021 0.84  0.44 - 1.00 mg/dL Final   Calcium 09/22/2021 9.6  8.9 - 10.3 mg/dL Final   Total Protein 82/50/5397 7.6  6.5 - 8.1 g/dL Final   Albumin 67/34/1937 4.2  3.5 - 5.0 g/dL Final   AST 90/24/0973 15  15 - 41 U/L Final   ALT 07/23/2021 13  0 - 44  U/L Final   Alkaline Phosphatase 07/23/2021 43  38 - 126 U/L Final   Total Bilirubin 07/23/2021 0.4  0.3 - 1.2 mg/dL Final   GFR, Estimated 07/23/2021 >60  >60 mL/min Final   Anion gap 07/23/2021 11  5 - 15 Final   WBC 07/23/2021 7.9  4.0 - 10.5 K/uL Final   RBC 07/23/2021 4.72  3.87 - 5.11 MIL/uL Final   Hemoglobin 07/23/2021 15.0  12.0 - 15.0 g/dL Final   HCT 09/22/2021 44.5  36.0 - 46.0 % Final   MCV 07/23/2021 94.3  80.0 - 100.0 fL Final   MCH 07/23/2021 31.8  26.0 - 34.0 pg Final   MCHC 07/23/2021 33.7  30.0 - 36.0 g/dL Final   RDW 09/22/2021 13.1  11.5 - 15.5 %  Final   Platelets 07/23/2021 308  150 - 400 K/uL Final   nRBC 07/23/2021 0.0  0.0 - 0.2 % Final   Color, Urine 07/23/2021 YELLOW  YELLOW Final   APPearance 07/23/2021 CLEAR  CLEAR Final   Specific Gravity, Urine 07/23/2021 1.024  1.005 - 1.030 Final   pH 07/23/2021 7.0  5.0 - 8.0 Final   Glucose, UA 07/23/2021 NEGATIVE  NEGATIVE mg/dL Final   Hgb urine dipstick 07/23/2021 NEGATIVE  NEGATIVE Final   Bilirubin Urine 07/23/2021 NEGATIVE  NEGATIVE Final   Ketones, ur 07/23/2021 NEGATIVE  NEGATIVE mg/dL Final   Protein, ur 38/18/2993 TRACE (A) NEGATIVE mg/dL Final   Nitrite 71/69/6789 NEGATIVE  NEGATIVE Final   Leukocytes,Ua 07/23/2021 NEGATIVE  NEGATIVE Final   Preg Test, Ur 07/23/2021 NEGATIVE  NEGATIVE Final   Yeast Wet Prep HPF POC 07/23/2021 NONE SEEN  NONE SEEN Final   Trich, Wet Prep 07/23/2021 NONE SEEN  NONE SEEN Final   Clue Cells Wet Prep HPF POC 07/23/2021 NONE SEEN  NONE SEEN Final   WBC, Wet Prep HPF POC 07/23/2021 MANY (A) NONE SEEN Final   Sperm 07/23/2021 NONE SEEN   Final   Neisseria Gonorrhea 07/23/2021 Negative   Final   Chlamydia 07/23/2021 Negative   Final   Comment 07/23/2021 Normal Reference Ranger Chlamydia - Negative   Final   Comment 07/23/2021 Normal Reference Range Neisseria Gonorrhea - Negative   Final     Commons side effects, risks, benefits, and alternatives for medications and treatment  plan prescribed today were discussed, and the patient expressed understanding of the given instructions. Patient is instructed to call or message via MyChart if he/she has any questions or concerns regarding our treatment plan. No barriers to understanding were identified. We discussed Red Flag symptoms and signs in detail. Patient expressed understanding regarding what to do in case of urgent or emergency type symptoms.  Medication list was reconciled, printed and provided to the patient in AVS. Patient instructions and summary information was reviewed with the patient as documented in the AVS. This note was prepared with assistance of Dragon voice recognition software. Occasional wrong-word or sound-a-like substitutions may have occurred due to the inherent limitations of voice recognition software  This visit occurred during the SARS-CoV-2 public health emergency.  Safety protocols were in place, including screening questions prior to the visit, additional usage of staff PPE, and extensive cleaning of exam room while observing appropriate contact time as indicated for disinfecting solutions.

## 2021-07-28 NOTE — Patient Instructions (Addendum)
Please return for your annual complete physical; please come fasting. Sooner if your abdominal pain recurs.   Complete 2 weeks of the high dose protonix, then switch to protonix 20mg  daily for 6-12 weeks.   We will schedule a repeat CT scan in about 12 months to follow up on the nodule.   If you have any questions or concerns, please don't hesitate to send me a message via MyChart or call the office at [phone]. Thank you for visiting with [phone] today! It's our pleasure caring for you.

## 2021-08-20 IMAGING — CT CT ABD-PELV W/ CM
2 of 4 series · 16 of 46 positions shown, 18 images · IV contrast (APPLIED)
Comparison: None.

CLINICAL DATA: Generalized abdominal pain that started 07/17/2021

EXAM:
CT ABDOMEN AND PELVIS WITH CONTRAST
TECHNIQUE: Multidetector CT imaging of the abdomen and pelvis was performed
using the standard protocol following bolus administration of
intravenous contrast.
CONTRAST:  80mL OMNIPAQUE IOHEXOL 300 MG/ML  SOLN

[Series 2: abd pel w · axial · 0.65mm/px · z∈[+921,+1301]mm · 13 of 84 slices shown, 15 images]
[im 4/84  soft-tissue]
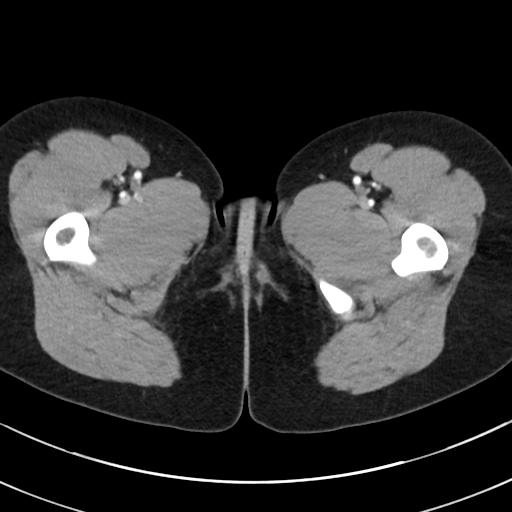
[im 4/84  bone]
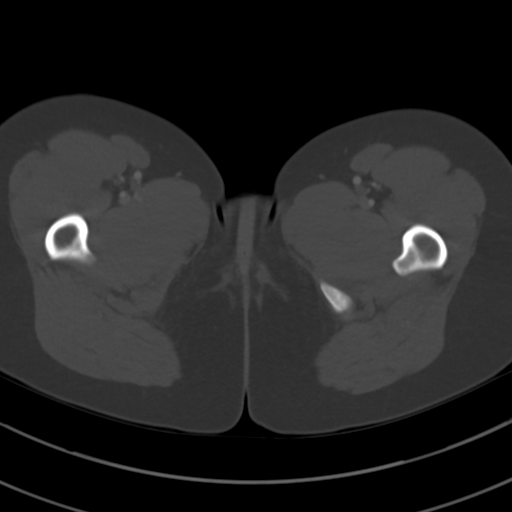
[im 11/84  soft-tissue]
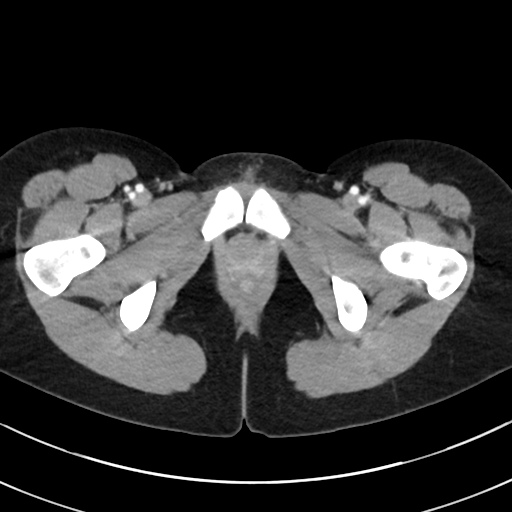
[im 18/84  soft-tissue]
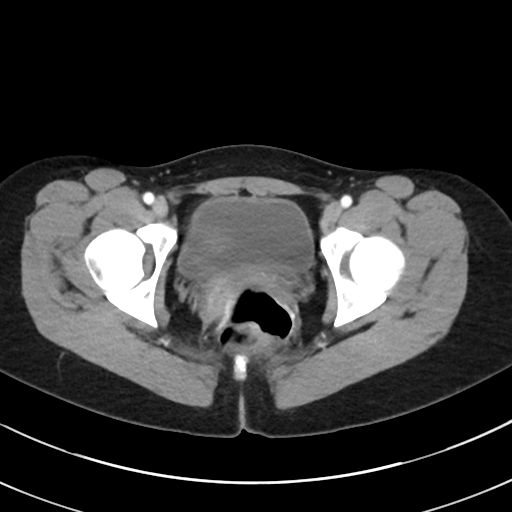
[im 25/84  soft-tissue]
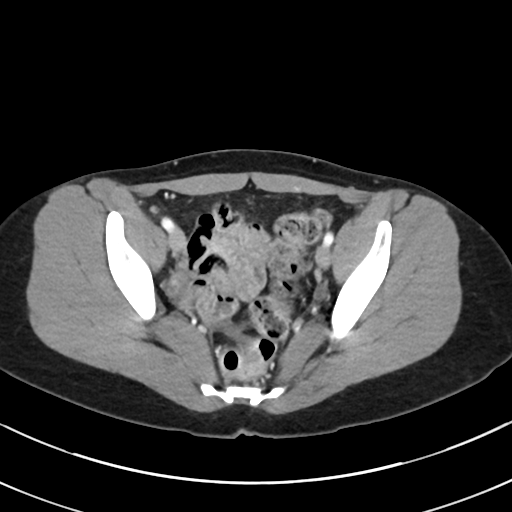
[im 28/84  soft-tissue]
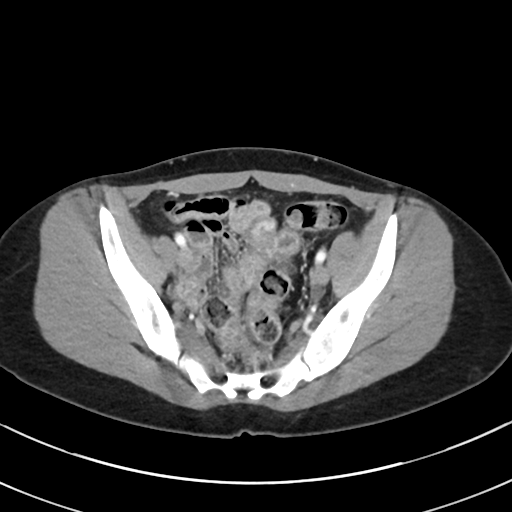
[im 35/84  soft-tissue]
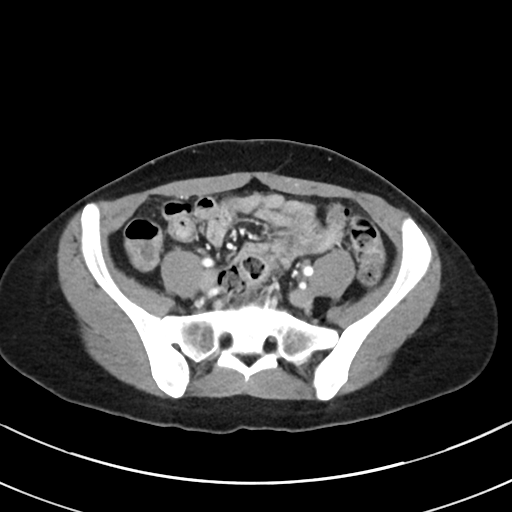
[im 42/84  soft-tissue]
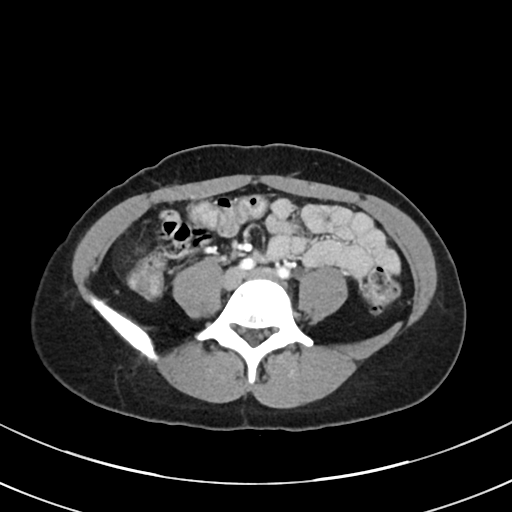
[im 49/84  soft-tissue]
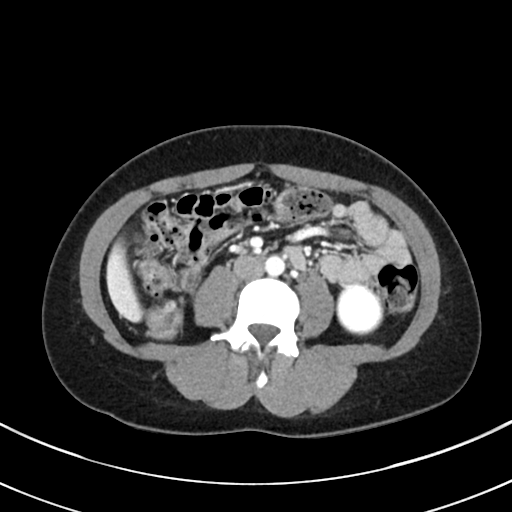
[im 56/84  soft-tissue]
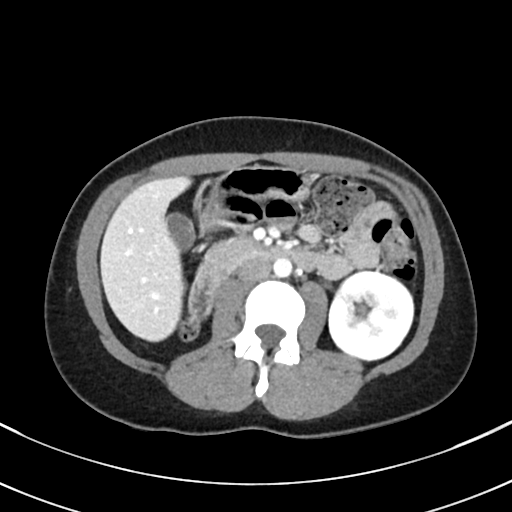
[im 56/84  bone]
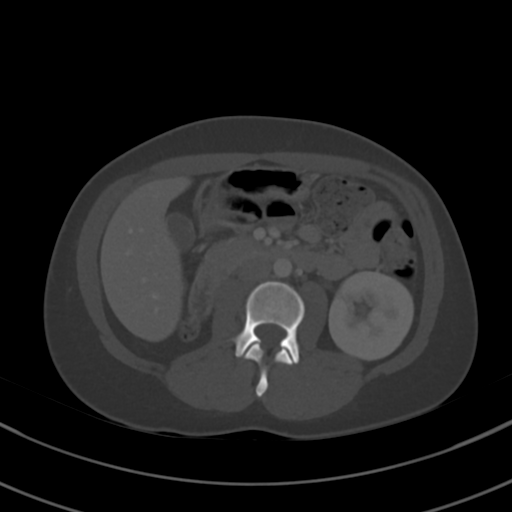
[im 59/84  soft-tissue]
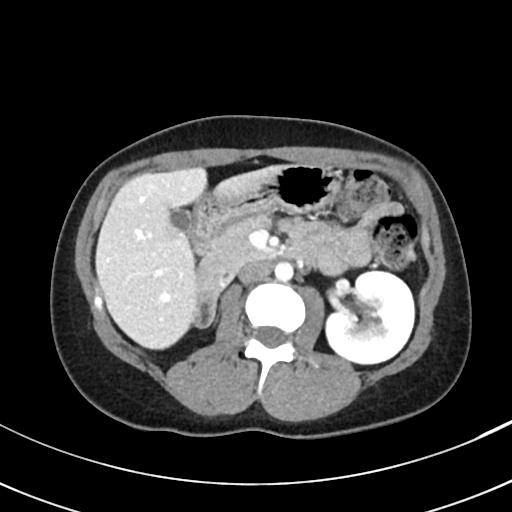
[im 66/84  soft-tissue]
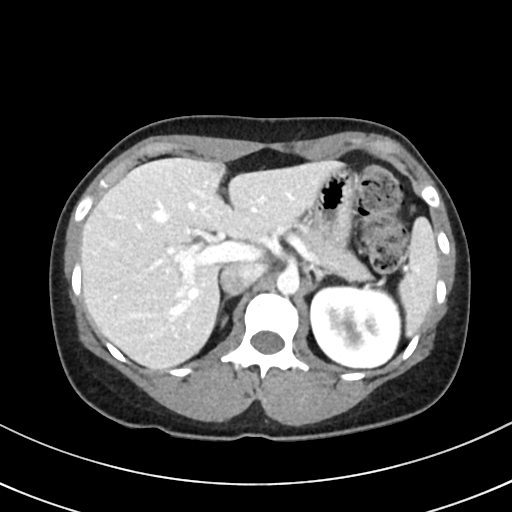
[im 73/84  soft-tissue]
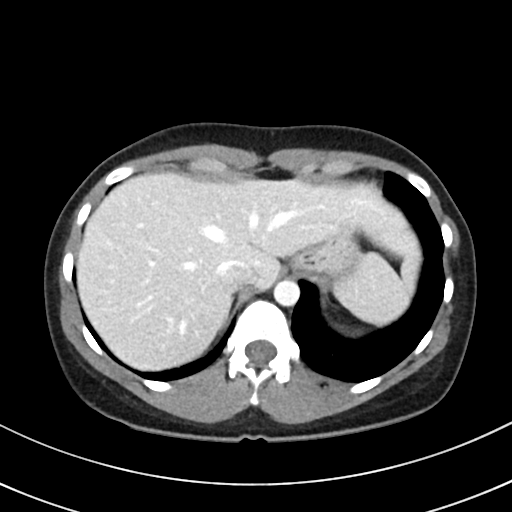
[im 80/84  soft-tissue]
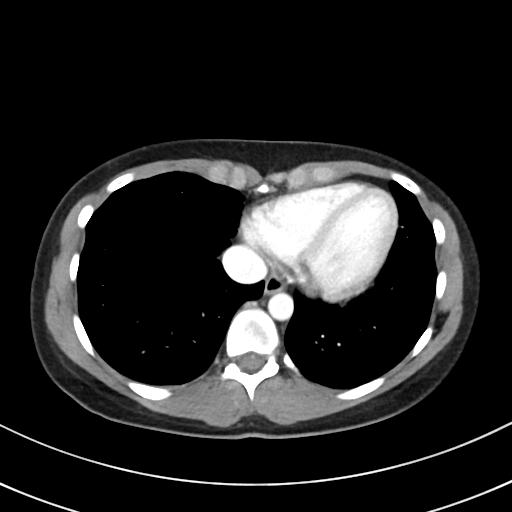

[Series 5: coronal · coronal · 0.70mm/px · 3 of 77 slices shown]
[im 26/77  soft-tissue]
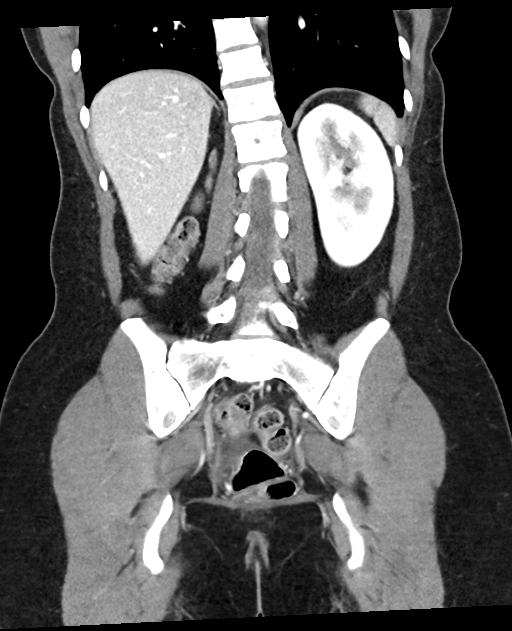
[im 34/77  soft-tissue]
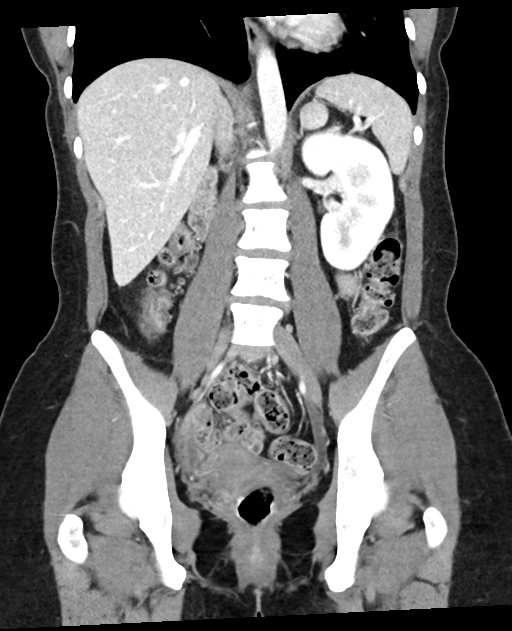
[im 43/77  soft-tissue]
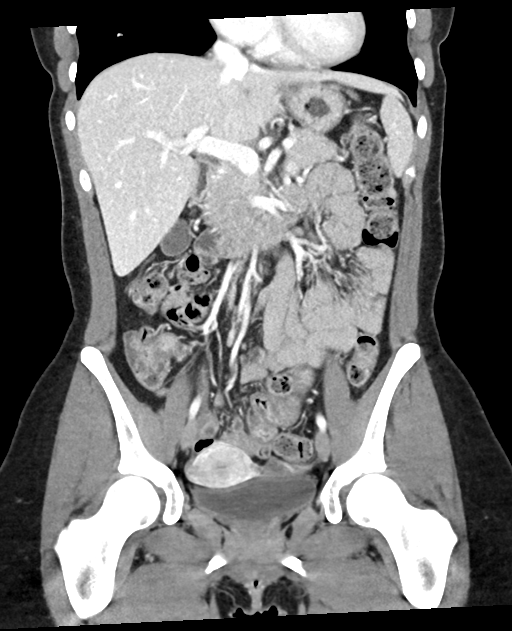

[16 of 46 positions shown; findings below may reference images not displayed]

FINDINGS: Lower chest:  No contributory findings.

Hepatobiliary: No focal liver abnormality.No evidence of biliary
obstruction or stone.

Pancreas: Unremarkable.

Spleen: Unremarkable.

Adrenals/Urinary Tract: 19 mm left adrenal nodule. No chart history
of malignancy. Tiny nonenhancing right kidney attributed to early
insult in life. Normal appearance of the left kidney. Unremarkable
bladder.

Stomach/Bowel:  No obstruction. No appendicitis.

Vascular/Lymphatic: No acute vascular abnormality. No mass or
adenopathy.

Reproductive:No pathologic findings.

Other: No ascites or pneumoperitoneum.

Musculoskeletal: No acute abnormalities.
IMPRESSION: 1. No acute finding.
2. Tiny and nonfunctional right kidney, probable vascular insult
early in life.
3. 19 mm left adrenal nodule, probably benign. Consider 12 month
follow-up adrenal CT.

## 2021-09-01 ENCOUNTER — Telehealth: Payer: No Typology Code available for payment source | Admitting: Family Medicine

## 2021-12-08 ENCOUNTER — Encounter: Payer: Self-pay | Admitting: Family Medicine

## 2021-12-09 NOTE — Telephone Encounter (Signed)
Called patient and offered her appt patient declined she said he sore throat had started going away. Patient states she will call back if it doesn't get any better.

## 2022-07-08 ENCOUNTER — Ambulatory Visit: Payer: No Typology Code available for payment source | Admitting: Family Medicine

## 2022-09-14 ENCOUNTER — Encounter: Payer: Self-pay | Admitting: *Deleted

## 2022-09-28 ENCOUNTER — Encounter: Payer: No Typology Code available for payment source | Admitting: Family Medicine

## 2022-10-01 LAB — OB RESULTS CONSOLE HEPATITIS B SURFACE ANTIGEN: Hepatitis B Surface Ag: NEGATIVE

## 2022-10-01 LAB — OB RESULTS CONSOLE VARICELLA ZOSTER ANTIBODY, IGG: Varicella: IMMUNE

## 2022-10-01 LAB — HEPATITIS C ANTIBODY: HCV Ab: NEGATIVE

## 2022-10-01 LAB — OB RESULTS CONSOLE RUBELLA ANTIBODY, IGM: Rubella: IMMUNE

## 2022-10-01 LAB — OB RESULTS CONSOLE HIV ANTIBODY (ROUTINE TESTING): HIV: NONREACTIVE

## 2022-10-01 LAB — OB RESULTS CONSOLE GC/CHLAMYDIA
Chlamydia: NEGATIVE
Neisseria Gonorrhea: NEGATIVE

## 2022-12-03 ENCOUNTER — Encounter: Payer: Self-pay | Admitting: *Deleted

## 2022-12-21 NOTE — L&D Delivery Note (Signed)
Delivery Note Patient had a very protracted labor.  She was 10 cm at 0100 but did not start pushing until 0420.  The initial 2-3 hours of pushing were quite ineffective.  At 9:32 AM a viable female was delivered via Vaginal, Spontaneous (Presentation: ROA  ).  APGAR: 8, 9; weight 7 lb 10.8 oz (3480 gm) .   Placenta status: Spontaneous, Intact.  Cord: 3 vessels with the following complications:None  .  Cord pH: n/a  Given protracted labor and chorioamnionitis, 1 gm txa was given with the postpartum pitocin following delivery.  There was a large gush of blood and clot with the delivery of the placenta, but otherwise the uterus was firm   Anesthesia: Epidural Episiotomy:  None Lacerations:  Vaginal + hemostatic left labial Suture Repair: 3.0 vicryl rapide Est. Blood Loss (mL):  513 mL  Mom to postpartum.  Baby to Couplet care / Skin to Skin.  Inas Avena GEFFEL Delores Edelstein 04/25/2023, 10:19 AM

## 2023-01-29 LAB — OB RESULTS CONSOLE HIV ANTIBODY (ROUTINE TESTING): HIV: NONREACTIVE

## 2023-04-08 LAB — OB RESULTS CONSOLE GBS: GBS: NEGATIVE

## 2023-04-24 ENCOUNTER — Inpatient Hospital Stay (HOSPITAL_COMMUNITY): Payer: Medicaid Other | Admitting: Anesthesiology

## 2023-04-24 ENCOUNTER — Other Ambulatory Visit: Payer: Self-pay

## 2023-04-24 ENCOUNTER — Encounter (HOSPITAL_COMMUNITY): Payer: Self-pay | Admitting: *Deleted

## 2023-04-24 ENCOUNTER — Inpatient Hospital Stay (HOSPITAL_COMMUNITY)
Admission: AD | Admit: 2023-04-24 | Discharge: 2023-04-27 | DRG: 807 | Disposition: A | Discharge status: Home / Self Care | Payer: Medicaid Other | Attending: Obstetrics | Admitting: Obstetrics

## 2023-04-24 DIAGNOSIS — O9962 Diseases of the digestive system complicating childbirth: Secondary | ICD-10-CM | POA: Diagnosis present

## 2023-04-24 DIAGNOSIS — O411230 Chorioamnionitis, third trimester, not applicable or unspecified: Principal | ICD-10-CM | POA: Diagnosis present

## 2023-04-24 DIAGNOSIS — O26893 Other specified pregnancy related conditions, third trimester: Secondary | ICD-10-CM | POA: Diagnosis present

## 2023-04-24 DIAGNOSIS — R12 Heartburn: Secondary | ICD-10-CM | POA: Diagnosis present

## 2023-04-24 DIAGNOSIS — Z3A39 39 weeks gestation of pregnancy: Secondary | ICD-10-CM

## 2023-04-24 HISTORY — DX: Chronic kidney disease, unspecified: N18.9

## 2023-04-24 LAB — TYPE AND SCREEN
ABO/RH(D): O POS
Antibody Screen: NEGATIVE

## 2023-04-24 LAB — CBC
HCT: 42 % (ref 36.0–46.0)
Hemoglobin: 14.7 g/dL (ref 12.0–15.0)
MCH: 33.6 pg (ref 26.0–34.0)
MCHC: 35 g/dL (ref 30.0–36.0)
MCV: 96.1 fL (ref 80.0–100.0)
Platelets: 199 10*3/uL (ref 150–400)
RBC: 4.37 MIL/uL (ref 3.87–5.11)
RDW: 14.1 % (ref 11.5–15.5)
WBC: 6.6 10*3/uL (ref 4.0–10.5)
nRBC: 0 % (ref 0.0–0.2)

## 2023-04-24 LAB — POCT FERN TEST: POCT Fern Test: POSITIVE

## 2023-04-24 MED ORDER — PANTOPRAZOLE SODIUM 40 MG PO TBEC
40.0000 mg | DELAYED_RELEASE_TABLET | Freq: Every day | ORAL | Status: DC
  Administered 2023-04-24: 40 mg via ORAL
  Filled 2023-04-24: qty 1

## 2023-04-24 MED ORDER — LACTATED RINGERS IV SOLN
500.0000 mL | INTRAVENOUS | Status: DC | PRN
Start: 2023-04-24 — End: 2023-04-25
  Administered 2023-04-25: 500 mL via INTRAVENOUS

## 2023-04-24 MED ORDER — EPHEDRINE 5 MG/ML INJ: 10.0000 mg | INTRAVENOUS | Status: DC | PRN

## 2023-04-24 MED ORDER — SOD CITRATE-CITRIC ACID 500-334 MG/5ML PO SOLN
30.0000 mL | ORAL | Status: DC | PRN
  Administered 2023-04-25: 30 mL via ORAL
  Filled 2023-04-24: qty 30

## 2023-04-24 MED ORDER — ONDANSETRON HCL 4 MG/2ML IJ SOLN
4.0000 mg | Freq: Four times a day (QID) | INTRAMUSCULAR | Status: DC | PRN
  Administered 2023-04-24 – 2023-04-25 (×3): 4 mg via INTRAVENOUS
  Filled 2023-04-24 (×3): qty 2

## 2023-04-24 MED ORDER — LACTATED RINGERS IV SOLN
500.0000 mL | Freq: Once | INTRAVENOUS | Status: AC
  Administered 2023-04-24: 500 mL via INTRAVENOUS

## 2023-04-24 MED ORDER — PHENYLEPHRINE 80 MCG/ML (10ML) SYRINGE FOR IV PUSH (FOR BLOOD PRESSURE SUPPORT): 80.0000 ug | PREFILLED_SYRINGE | INTRAVENOUS | Status: DC | PRN

## 2023-04-24 MED ORDER — OXYTOCIN-SODIUM CHLORIDE 30-0.9 UT/500ML-% IV SOLN
2.5000 [IU]/h | INTRAVENOUS | Status: DC
  Administered 2023-04-25: 2.5 [IU]/h via INTRAVENOUS
  Filled 2023-04-24: qty 500

## 2023-04-24 MED ORDER — LACTATED RINGERS IV SOLN: INTRAVENOUS | Status: DC

## 2023-04-24 MED ORDER — OXYCODONE-ACETAMINOPHEN 5-325 MG PO TABS: 1.0000 | ORAL_TABLET | ORAL | Status: DC | PRN

## 2023-04-24 MED ORDER — OXYTOCIN BOLUS FROM INFUSION
333.0000 mL | Freq: Once | INTRAVENOUS | Status: AC
  Administered 2023-04-25: 333 mL via INTRAVENOUS

## 2023-04-24 MED ORDER — DIPHENHYDRAMINE HCL 50 MG/ML IJ SOLN: 12.5000 mg | INTRAMUSCULAR | Status: DC | PRN

## 2023-04-24 MED ORDER — FENTANYL CITRATE (PF) 100 MCG/2ML IJ SOLN
50.0000 ug | INTRAMUSCULAR | Status: DC | PRN
  Administered 2023-04-24: 50 ug via INTRAVENOUS
  Filled 2023-04-24: qty 2

## 2023-04-24 MED ORDER — LIDOCAINE HCL (PF) 1 % IJ SOLN
INTRAMUSCULAR | Status: DC | PRN
  Administered 2023-04-24: 5 mL via EPIDURAL

## 2023-04-24 MED ORDER — LIDOCAINE HCL (PF) 1 % IJ SOLN: 30.0000 mL | INTRAMUSCULAR | Status: DC | PRN

## 2023-04-24 MED ORDER — OXYCODONE-ACETAMINOPHEN 5-325 MG PO TABS: 2.0000 | ORAL_TABLET | ORAL | Status: DC | PRN

## 2023-04-24 MED ORDER — FENTANYL-BUPIVACAINE-NACL 0.5-0.125-0.9 MG/250ML-% EP SOLN
EPIDURAL | Status: DC | PRN
  Administered 2023-04-24: 12 mL/h via EPIDURAL
  Administered 2023-04-25: 500 ug via EPIDURAL

## 2023-04-24 MED ORDER — FENTANYL-BUPIVACAINE-NACL 0.5-0.125-0.9 MG/250ML-% EP SOLN
12.0000 mL/h | EPIDURAL | Status: DC | PRN
  Filled 2023-04-24 (×2): qty 250

## 2023-04-24 MED ORDER — ACETAMINOPHEN 325 MG PO TABS: 650.0000 mg | ORAL_TABLET | ORAL | Status: DC | PRN

## 2023-04-24 NOTE — Anesthesia Procedure Notes (Signed)
Epidural Patient location during procedure: OB Start time: 04/24/2023 6:43 PM End time: 04/24/2023 7:02 PM  Staffing Anesthesiologist: Trevor Iha, MD Performed: anesthesiologist   Preanesthetic Checklist Completed: patient identified, IV checked, site marked, risks and benefits discussed, surgical consent, monitors and equipment checked, pre-op evaluation and timeout performed  Epidural Patient position: sitting Prep: DuraPrep and site prepped and draped Patient monitoring: continuous pulse ox and blood pressure Approach: midline Location: L3-L4 Injection technique: LOR air  Needle:  Needle type: Tuohy  Needle gauge: 17 G Needle length: 9 cm and 9 Needle insertion depth: 6 cm Catheter type: closed end flexible Catheter size: 19 Gauge Catheter at skin depth: 12 cm Test dose: negative  Assessment Events: blood not aspirated, no cerebrospinal fluid, injection not painful, no injection resistance, no paresthesia and negative IV test  Additional Notes Patient identified. Risks/Benefits/Options discussed with patient including but not limited to bleeding, infection, nerve damage, paralysis, failed block, incomplete pain control, headache, blood pressure changes, nausea, vomiting, reactions to medication both or allergic, itching and postpartum back pain. Confirmed with bedside nurse the patient's most recent platelet count. Confirmed with patient that they are not currently taking any anticoagulation, have any bleeding history or any family history of bleeding disorders. Patient expressed understanding and wished to proceed. All questions were answered. Sterile technique was used throughout the entire procedure. Please see nursing notes for vital signs. Test dose was given through epidural needle and negative prior to continuing to dose epidural or start infusion. Warning signs of high block given to the patient including shortness of breath, tingling/numbness in hands, complete motor  block, or any concerning symptoms with instructions to call for help. Patient was given instructions on fall risk and not to get out of bed. All questions and concerns addressed with instructions to call with any issues.  1Attempt (S) . Patient tolerated procedure well.

## 2023-04-24 NOTE — MAU Note (Signed)
Pt informed that the ultrasound is considered a limited OB ultrasound and is not intended to be a complete ultrasound exam.  Patient also informed that the ultrasound is not being completed with the intent of assessing for fetal or placental anomalies or any pelvic abnormalities.  Explained that the purpose of today's ultrasound is to assess for presentation.  Patient acknowledges the purpose of the exam and the limitations of the study.    Vertex presentation confirmed 

## 2023-04-24 NOTE — MAU Note (Signed)
.  Carol Walters is a 27 y.o. at [redacted]w[redacted]d here in MAU reporting: SROM at 0530 this morning, clear fluid. Denies VB. +FM. Ctx every 2-4 minutes.   Pain score: 6 Vitals:   04/24/23 1504  BP: 127/84  Pulse: 81  Resp: 14  Temp: 98.2 F (36.8 C)

## 2023-04-24 NOTE — Progress Notes (Signed)
Patient requests cervical exam--feeling more pressure with contractions  Toco: q2-3 minutes EFM: 130s, moderate variability SVE: 5/100/-1  Progressing well, but increased pain.  Requests epidural at this time

## 2023-04-24 NOTE — Anesthesia Preprocedure Evaluation (Signed)
Anesthesia Evaluation  Patient identified by MRN, date of birth, ID band Patient awake    Reviewed: Allergy & Precautions, NPO status , Patient's Chart, lab work & pertinent test results  Airway Mallampati: II  TM Distance: >3 FB Neck ROM: Full    Dental no notable dental hx. (+) Teeth Intact, Dental Advisory Given   Pulmonary neg pulmonary ROS   Pulmonary exam normal breath sounds clear to auscultation       Cardiovascular negative cardio ROS Normal cardiovascular exam Rhythm:Regular Rate:Normal     Neuro/Psych negative neurological ROS  negative psych ROS   GI/Hepatic negative GI ROS, Neg liver ROS,,,  Endo/Other    Renal/GU Renal disease     Musculoskeletal   Abdominal   Peds  Hematology Lab Results      Component                Value               Date                      WBC                      6.6                 04/24/2023                HGB                      14.7                04/24/2023                HCT                      42.0                04/24/2023                MCV                      96.1                04/24/2023                PLT                      199                 04/24/2023              Anesthesia Other Findings AlL: amoxicillin  Reproductive/Obstetrics (+) Pregnancy                             Anesthesia Physical Anesthesia Plan  ASA: 2  Anesthesia Plan: Epidural   Post-op Pain Management:    Induction:   PONV Risk Score and Plan:   Airway Management Planned:   Additional Equipment:   Intra-op Plan:   Post-operative Plan:   Informed Consent: I have reviewed the patients History and Physical, chart, labs and discussed the procedure including the risks, benefits and alternatives for the proposed anesthesia with the patient or authorized representative who has indicated his/her understanding and acceptance.       Plan Discussed  with:   Anesthesia Plan Comments: (39 wk  primagravida for LEA)       Anesthesia Quick Evaluation

## 2023-04-24 NOTE — Progress Notes (Signed)
More comfortable with epidural, but still feeling some pressure  BP 110/68   Pulse (!) 129   Temp 98.4 F (36.9 C) (Oral)   Resp 20   Ht 5\' 2"  (1.575 m)   Wt 69.1 kg   BMI 27.86 kg/m   Toco: not tracing well on side, q2-3 minutes immediately following epidural EFM: 130s moderate variability SVE: 9/90/0  G1 @ [redacted]w[redacted]d with active labor Progressed quickly from 5 to 9, now unchanged over 1.5 hours.   Prefers to minimize interventions.  Will continue position changes with peanut ball.  If unchanged in 1 hour, consider pitocin

## 2023-04-24 NOTE — H&P (Signed)
27 y.o. G1P0 @ [redacted]w[redacted]d presents with rupture of membranes and contractions.  She reports leakage of clear fluid at 0520. Presented to MAU at approximately 1500.  Contractions have increased over the last few hours and are now painful q2-4 minutes.  Otherwise has good fetal movement and no bleeding.  Pregnancy complicated by: Failed 1hr gtt.  Declined 3hr gtt.  Did not check FSBG, but made dietary modifications. No growth [place] since anatomy scan  Past Medical History:  Diagnosis Date   Bacterial vaginosis - recurrent BV 09/27/2019   Chronic kidney disease    Near syncope 09/27/2019   Negative cardiac workup    Past Surgical History:  Procedure Laterality Date   NO PAST SURGERIES      OB History  Gravida Para Term Preterm AB Living  1            SAB IAB Ectopic Multiple Live Births               # Outcome Date GA Lbr Len/2nd Weight Sex Delivery Anes PTL Lv  1 Current             Social History   Socioeconomic History   Marital status: Single    Spouse name: brandon   Number of children: Not on file   Years of education: Not on file   Highest education level: Not on file  Occupational History   Occupation: Teacher, adult education: [employer]    Comment: pre-op  Tobacco Use   Smoking status: Never   Smokeless tobacco: Never  Substance and Sexual Activity   Alcohol use: Yes    Comment: occ   Drug use: Never   Sexual activity: Yes    Birth control/protection: Pill  Other Topics Concern   Not on file  Social History Narrative   Not on file   Social Determinants of Health   Financial Resource Strain: Not on file  Food Insecurity: No Food Insecurity (04/24/2023)   Hunger Vital Sign    Worried About Running Out of Food in the Last Year: Never true    Ran Out of Food in the Last Year: Never true  Transportation Needs: No Transportation Needs (04/24/2023)   PRAPARE - Administrator, Civil Service (Medical): No    Lack of Transportation (Non-Medical): No  Physical  Activity: Not on file  Stress: Not on file  Social Connections: Not on file  Intimate Partner Violence: Not At Risk (04/24/2023)   Humiliation, Afraid, Rape, and Kick questionnaire    Fear of Current or Ex-Partner: No    Emotionally Abused: No    Physically Abused: No    Sexually Abused: No   Food and Amoxicillin    Prenatal Transfer Tool  Maternal Diabetes: See above--failed 1hr, declined 3hr, did not check blood glucose during pregnancy Genetic Screening: Normal Maternal Ultrasounds/Referrals: Normal Fetal Ultrasounds or other Referrals:  None Maternal Substance Abuse:  No Significant Maternal Medications:  None Significant Maternal Lab Results: Group B Strep negative  ABO, Rh: --/--/O POS (05/04 1540) Antibody: NEG (05/04 1540) Rubella:  Immune RPR:   NR HBsAg:   Negative HIV:   Negative GBS:   Negative       Vitals:   04/24/23 1559 04/24/23 1711  BP:    Pulse:    Resp: 16 20  Temp: 97.9 F (36.6 C)      General:  NAD Abdomen:  soft, gravid, EFW 6.5-7# Ex:  no edema SVE:  4/90/-2 /  vertex FHTs:  130s, moderate variability, + accelerations Toco:  q2-4 minutes   A/P   27 y.o. G1P0 [redacted]w[redacted]d presents with labor Pain management: Nitrous oxide currently for pain control.  Reviewed all options 2.   Labor: would prefer a low intervention approach if possible.  Contracting well currently.  Will monitor closely (ROM x 12 hours)  3.  GBS negative   Donyell Carrell GEFFEL [place]iny[place]

## 2023-04-25 ENCOUNTER — Encounter (HOSPITAL_COMMUNITY): Payer: Self-pay | Admitting: Obstetrics

## 2023-04-25 LAB — RPR: RPR Ser Ql: NONREACTIVE

## 2023-04-25 MED ORDER — SENNOSIDES-DOCUSATE SODIUM 8.6-50 MG PO TABS
2.0000 | ORAL_TABLET | ORAL | Status: DC
  Administered 2023-04-26: 2 via ORAL
  Filled 2023-04-25 (×3): qty 2

## 2023-04-25 MED ORDER — DIBUCAINE (PERIANAL) 1 % EX OINT: 1.0000 | TOPICAL_OINTMENT | CUTANEOUS | Status: DC | PRN

## 2023-04-25 MED ORDER — SIMETHICONE 80 MG PO CHEW: 80.0000 mg | CHEWABLE_TABLET | ORAL | Status: DC | PRN

## 2023-04-25 MED ORDER — TETANUS-DIPHTH-ACELL PERTUSSIS 5-2.5-18.5 LF-MCG/0.5 IM SUSY: 0.5000 mL | PREFILLED_SYRINGE | Freq: Once | INTRAMUSCULAR | Status: DC

## 2023-04-25 MED ORDER — ONDANSETRON HCL 4 MG PO TABS: 4.0000 mg | ORAL_TABLET | ORAL | Status: DC | PRN

## 2023-04-25 MED ORDER — DIPHENHYDRAMINE HCL 25 MG PO CAPS: 25.0000 mg | ORAL_CAPSULE | Freq: Four times a day (QID) | ORAL | Status: DC | PRN

## 2023-04-25 MED ORDER — IBUPROFEN 600 MG PO TABS
600.0000 mg | ORAL_TABLET | Freq: Four times a day (QID) | ORAL | Status: DC
  Administered 2023-04-25 (×2): 600 mg via ORAL
  Filled 2023-04-25 (×2): qty 1

## 2023-04-25 MED ORDER — PRENATAL MULTIVITAMIN CH
1.0000 | ORAL_TABLET | Freq: Every day | ORAL | Status: DC
  Administered 2023-04-26: 1 via ORAL
  Filled 2023-04-25 (×2): qty 1

## 2023-04-25 MED ORDER — ACETAMINOPHEN 325 MG PO TABS
650.0000 mg | ORAL_TABLET | ORAL | Status: DC | PRN
  Administered 2023-04-25: 650 mg via ORAL
  Administered 2023-04-26: 325 mg via ORAL
  Administered 2023-04-27: 650 mg via ORAL
  Filled 2023-04-25 (×3): qty 2

## 2023-04-25 MED ORDER — CEFAZOLIN SODIUM-DEXTROSE 2-4 GM/100ML-% IV SOLN
2.0000 g | Freq: Three times a day (TID) | INTRAVENOUS | Status: DC
  Administered 2023-04-25: 2 g via INTRAVENOUS
  Filled 2023-04-25: qty 100

## 2023-04-25 MED ORDER — COCONUT OIL OIL: 1.0000 | TOPICAL_OIL | Status: DC | PRN

## 2023-04-25 MED ORDER — ONDANSETRON HCL 4 MG/2ML IJ SOLN: 4.0000 mg | INTRAMUSCULAR | Status: DC | PRN

## 2023-04-25 MED ORDER — ACETAMINOPHEN 500 MG PO TABS
1000.0000 mg | ORAL_TABLET | Freq: Four times a day (QID) | ORAL | Status: DC | PRN
  Administered 2023-04-25: 1000 mg via ORAL
  Filled 2023-04-25: qty 2

## 2023-04-25 MED ORDER — OXYCODONE HCL 5 MG PO TABS: 5.0000 mg | ORAL_TABLET | ORAL | Status: DC | PRN

## 2023-04-25 MED ORDER — TRANEXAMIC ACID-NACL 1000-0.7 MG/100ML-% IV SOLN
1000.0000 mg | Freq: Once | INTRAVENOUS | Status: AC | PRN
  Administered 2023-04-25: 1000 mg via INTRAVENOUS

## 2023-04-25 MED ORDER — WITCH HAZEL-GLYCERIN EX PADS: 1.0000 | MEDICATED_PAD | CUTANEOUS | Status: DC | PRN

## 2023-04-25 MED ORDER — BENZOCAINE-MENTHOL 20-0.5 % EX AERO
1.0000 | INHALATION_SPRAY | CUTANEOUS | Status: DC | PRN
  Filled 2023-04-25: qty 56

## 2023-04-25 MED ORDER — PANTOPRAZOLE SODIUM 40 MG PO TBEC: 40.0000 mg | DELAYED_RELEASE_TABLET | Freq: Every day | ORAL | Status: DC | PRN

## 2023-04-25 MED ORDER — GENTAMICIN SULFATE 40 MG/ML IJ SOLN
5.0000 mg/kg | INTRAVENOUS | Status: DC
  Administered 2023-04-25: 290 mg via INTRAVENOUS
  Filled 2023-04-25: qty 7.25

## 2023-04-25 MED ORDER — OXYCODONE HCL 5 MG PO TABS: 10.0000 mg | ORAL_TABLET | ORAL | Status: DC | PRN

## 2023-04-25 MED ORDER — TERBUTALINE SULFATE 1 MG/ML IJ SOLN: 0.2500 mg | Freq: Once | INTRAMUSCULAR | Status: DC | PRN

## 2023-04-25 MED ORDER — OXYTOCIN-SODIUM CHLORIDE 30-0.9 UT/500ML-% IV SOLN
1.0000 m[IU]/min | INTRAVENOUS | Status: DC
  Administered 2023-04-25: 2 m[IU]/min via INTRAVENOUS

## 2023-04-25 MED ORDER — TRANEXAMIC ACID-NACL 1000-0.7 MG/100ML-% IV SOLN
1000.0000 mg | Freq: Once | INTRAVENOUS | Status: DC
  Filled 2023-04-25: qty 100

## 2023-04-25 NOTE — Progress Notes (Signed)
Great effort and excellent progress over last 45 minutes.  Has pushed to +2.  Baseline is 165 with moderate variability.  Desires to continue pushing.  At this time, anticipate SVD shortly

## 2023-04-25 NOTE — Progress Notes (Signed)
RN checked patient at 0300 patient was complete and plus 2. Patient agreed to start pushing with contractions and pushed for 15 minutes. Patient asked to stop pushing and rest on her side with the peanut ball. Provider notified.

## 2023-04-25 NOTE — Progress Notes (Signed)
Patient has been completely dilated since 0100.  Due to a myriad of discomforts and fatigue, she has declined to initiate pushing.  She agreed to start pushing at 0420, but is struggling to push with consistent effort.  Contractions have spaced greatly to q6-7 minutes and she has now developed chorioamnionitis with elevated temperature, elevated HR and fetal tachycardia  BP 127/85   Pulse (!) 142   Temp (!) 101.3 F (38.5 C) (Axillary)   Resp 18   Ht 5\' 2"  (1.575 m)   Wt 69.1 kg   BMI 27.86 kg/m   EFM: 160s moderate variability, + accelerations Toco: q6-7 minutes  Will treat with ancef and gentamicin along with acetaminophen now.  I recommend starting pitocin (she has declined any intervention to this point) as we can not sustain any pushing effort due to infrequent contractions.  She is hesitant at this time

## 2023-04-25 NOTE — Progress Notes (Signed)
Patient started pushing technically at 0420.  However, between 0420 and 0500, she pushed approximately 5 times with ineffective effort.   Contractions were still infrequent between 0500 and 0545 (when she agreed to pitocin).  Over the last hour, her pushing has become effective and fetal station has moved from 0 to +1 with pushing.   We discussed guidelines for continued pushing.  I would discount all pushing time prior to 0500.  Her pulse is trending down as it fetal baseline s/p tylenol.  Variability remains moderate and there are no recurrent decelerations.  I offered the option to consider cesarean delivery but she strongly desires to continue pushing.  While we need to consider the possibility of CPD and undiagnosed gdm (failed 1hr, did not take 3hr gtt), I think the slow progress is more related to poor pushing over the first 2-2.5 hours.  Pelvis feels adequate. Will continue to monitor closely

## 2023-04-25 NOTE — Progress Notes (Addendum)
Patient feeling rectal pressure with contractions.  C/o heartburn not relived with PPI. On exam is 10/100/0 to +1 (pt on side) Due to discomfort in every position as well as heartburn, is reluctant to start pushing despite feeling pressure for the last few hours.  Tried with two contractions over the last 20 minutes with no fetal movement.  Patient reports that she is feeling pressured to start pushing and is unsure if she wants to start push or not.  EFM with baseline of 150, moderate variability.  Alerted her and partner that I would step out of the room and allow her some time to decide if she wants to initiate pushing.  She alerted RN 20 minutes later that she is not ready to push.  Will let us know when she wants to start.  Will continue to monitor fetal status closely

## 2023-04-25 NOTE — Progress Notes (Signed)
Pharmacy Antibiotic Note  Carol Walters is a 27 y.o. female admitted on 04/24/2023 for rupture of membranes and contractions.  Patient now with maternal temp and chorioamnionitis.  Pharmacy has been consulted for gentamicin dosing.  Plan: Gentamicin 5 mg/kg q24 hours  Height: 5\' 2"  (157.5 cm) Weight: 69.1 kg (152 lb 4.8 oz) IBW/kg (Calculated) : 50.1  Temp (24hrs), Avg:99.5 F (37.5 C), Min:97 F (36.1 C), Max:102.7 F (39.3 C)  Recent Labs  Lab 04/24/23 1540  WBC 6.6    CrCl cannot be calculated (Patient's most recent lab result is older than the maximum 21 days allowed.).    Allergies  Allergen Reactions   Food Itching    Allergic to dates , thrush in mouth with reaction   Amoxicillin Other (See Comments) and Rash    Unknown reaction. 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      Antimicrobials this admission: Cefazolin  2g Q8 5/5 >>    Thank you for allowing pharmacy to be a part of this patient's care.  Loyola Mast 04/25/2023 6:06 AM

## 2023-04-26 LAB — CBC
HCT: 33.3 % — ABNORMAL LOW (ref 36.0–46.0)
Hemoglobin: 11.5 g/dL — ABNORMAL LOW (ref 12.0–15.0)
MCH: 33.5 pg (ref 26.0–34.0)
MCHC: 34.5 g/dL (ref 30.0–36.0)
MCV: 97.1 fL (ref 80.0–100.0)
Platelets: 171 10*3/uL (ref 150–400)
RBC: 3.43 MIL/uL — ABNORMAL LOW (ref 3.87–5.11)
RDW: 14.4 % (ref 11.5–15.5)
WBC: 20.8 10*3/uL — ABNORMAL HIGH (ref 4.0–10.5)
nRBC: 0 % (ref 0.0–0.2)

## 2023-04-26 NOTE — Progress Notes (Signed)
POSTPARTUM PROGRESS NOTE  Post Partum Day #1  Subjective:  No acute events overnight.  Pt denies problems with ambulating, voiding or po intake.  She denies nausea or vomiting.  Pain is well controlled. Lochia Minimal.   Objective: Blood pressure 106/85, pulse 84, temperature 97.9 F (36.6 C), temperature source Oral, resp. rate 16, height 5\' 2"  (1.575 m), weight 69.1 kg, SpO2 98 %, unknown if currently breastfeeding.  Physical Exam:  General: alert, cooperative and no distress Lochia:normal flow Chest: CTAB Heart: RRR no m/r/g Abdomen: +BS, soft, nontender Uterine Fundus: firm, 2cm below umbilicus GU: suture intact, healing well, no purulent drainage Extremities: neg edema, neg calf TTP BL, neg Homans BL  Recent Labs    04/24/23 1540 04/26/23 0644  HGB 14.7 11.5*  HCT 42.0 33.3*    Assessment/Plan:  ASSESSMENT: Carol Walters is a 27 y.o. G1P1001 s/p SVD @ [redacted]w[redacted]d. PNC c/b failed 1hr GTT declining 3hr GTT.   Plan for discharge tomorrow, Breastfeeding, and Lactation consult   LOS: 2 days

## 2023-04-26 NOTE — Anesthesia Postprocedure Evaluation (Signed)
Anesthesia Post Note  Patient: PORTLYN MONINGER  Procedure(s) Performed: AN AD HOC LABOR EPIDURAL     Patient location during evaluation: Mother Baby Anesthesia Type: Epidural Level of consciousness: awake, awake and alert and oriented Pain management: pain level controlled Vital Signs Assessment: post-procedure vital signs reviewed and stable Respiratory status: spontaneous breathing, nonlabored ventilation and respiratory function stable Cardiovascular status: blood pressure returned to baseline and stable Postop Assessment: no headache, no backache, epidural receding, adequate PO intake, able to ambulate and no apparent nausea or vomiting Anesthetic complications: no   No notable events documented.  Last Vitals:  Vitals:   04/25/23 2230 04/26/23 0200  BP: (!) 87/59 106/85  Pulse: 84 84  Resp: 16 16  Temp: (!) 36.3 C 36.6 C  SpO2: 97% 98%    Last Pain:  Vitals:   04/26/23 1233  TempSrc:   PainSc: 0-No pain   Pain Goal: Patients Stated Pain Goal: 0 (04/24/23 1914)                 [employer]

## 2023-04-27 ENCOUNTER — Other Ambulatory Visit (HOSPITAL_COMMUNITY): Payer: Self-pay

## 2023-04-27 MED ORDER — IBUPROFEN 600 MG PO TABS
600.0000 mg | ORAL_TABLET | Freq: Four times a day (QID) | ORAL | 0 refills | Status: AC
  Filled 2023-04-27: qty 30, 8d supply, fill #0

## 2023-04-27 NOTE — Lactation Note (Signed)
This note was copied from a baby's chart. Lactation Consultation Note  Patient Name: Carol Walters Date: 04/27/2023 Age:29 hours Reason for consult: Initial assessment  P1, Mother has small abrasions on tip of R nipple causing some discomfort.  Provided comfort gels and mother also has coconut oil. Baby cluster fed last night.  Observed baby latch with ease, lips flanged, intermittent swallows.  Reviewed engorgement care and monitoring voids/stools. Feed on demand with cues.  Goal 8-12+ times per day after first 24 hrs.  Call for help as needed.  Maternal Data Has patient been taught Hand Expression?: Yes Does the patient have breastfeeding experience prior to this delivery?: No  Feeding Mother's Current Feeding Choice: Breast Milk  LATCH Score Latch: Grasps breast easily, tongue down, lips flanged, rhythmical sucking.  Audible Swallowing: A few with stimulation  Type of Nipple: Everted at rest and after stimulation  Comfort (Breast/Nipple): Filling, red/small blisters or bruises, mild/mod discomfort  Hold (Positioning): No assistance needed to correctly position infant at breast.  LATCH Score: 8   Lactation Tools Discussed/Used    Interventions Interventions: Breast feeding basics reviewed;Adjust position;Support pillows;Education;Comfort gels;Coconut oil;LC Services brochure  Discharge Discharge Education: Engorgement and breast care;Warning signs for feeding baby  Consult Status Consult Status: Complete    Hardie Pulley 04/27/2023, 8:48 AM

## 2023-04-27 NOTE — Discharge Summary (Signed)
Postpartum Discharge Summary      Patient Name: Carol Walters DOB: 15-Oct-1996 MRN: [id number]  Date of admission: 04/24/2023 Delivery date:04/25/2023  Delivering provider: Marlow Baars  Date of discharge: 04/27/2023  Admitting diagnosis: Normal labor [O80, Z37.9] Intrauterine pregnancy: [redacted]w[redacted]d     Secondary diagnosis:  Principal Problem:   Normal labor    Discharge diagnosis: Term Pregnancy Delivered                                               Hospital course: Onset of Labor With Vaginal Delivery      27 y.o. yo G1P1001 at [redacted]w[redacted]d was admitted in Latent Labor on 04/24/2023. Labor course was complicated by protracted labor  Membrane Rupture Time/Date: 5:26 AM ,04/24/2023   Delivery Method:Vaginal, Spontaneous  Episiotomy: None  Lacerations:  Vaginal  Patient had a postpartum course complicated by nothing.  She is ambulating, tolerating a regular diet, passing flatus, and urinating well. Patient is discharged home in stable condition on 04/27/23.  Newborn Data: Birth date:04/25/2023  Birth time:9:32 AM  Gender:Female  Living status:Living  Apgars:8 ,9  Weight:3480 g    Physical exam  Vitals:   04/25/23 2230 04/26/23 0200 04/26/23 1445 04/26/23 2130  BP: (!) 87/59 106/85 106/74 107/60  Pulse: 84 84 95 90  Resp: 16 16 16 16   Temp: (!) 97.4 F (36.3 C) 97.9 F (36.6 C) 97.7 F (36.5 C) 97.9 F (36.6 C)  TempSrc: Oral Oral Axillary Oral  SpO2: 97% 98% 97% 96%  Weight:      Height:       General: alert Lochia: appropriate Uterine Fundus: firm  Labs: Lab Results  Component Value Date   WBC 20.8 (H) 04/26/2023   HGB 11.5 (L) 04/26/2023   HCT 33.3 (L) 04/26/2023   MCV 97.1 04/26/2023   PLT 171 04/26/2023      Latest Ref Rng & Units 07/23/2021    9:30 AM  CMP  Glucose 70 - 99 mg/dL 81   BUN 6 - 20 mg/dL 15   Creatinine 1.02 - 1.00 mg/dL 7.25   Sodium 366 - 440 mmol/L 140   Potassium 3.5 - 5.1 mmol/L 3.4   Chloride 98 - 111 mmol/L 101   CO2 22 - 32 mmol/L 28    Calcium 8.9 - 10.3 mg/dL 9.6   Total Protein 6.5 - 8.1 g/dL 7.6   Total Bilirubin 0.3 - 1.2 mg/dL 0.4   Alkaline Phos 38 - 126 U/L 43   AST 15 - 41 U/L 15   ALT 0 - 44 U/L 13    Edinburgh Score:    04/25/2023    3:37 PM  Edinburgh Postnatal Depression Scale Screening Tool  I have been able to laugh and see the funny side of things. 0  I have looked forward with enjoyment to things. 0  I have blamed myself unnecessarily when things went wrong. 1  I have been anxious or worried for no good reason. 2  I have felt scared or panicky for no good reason. 1  Things have been getting on top of me. 1  I have been so unhappy that I have had difficulty sleeping. 0  I have felt sad or miserable. 0  I have been so unhappy that I have been crying. 0  The thought of harming myself has occurred to me.  0  Edinburgh Postnatal Depression Scale Total 5      After visit meds:  Allergies as of 04/27/2023       Reactions   Food Itching   Allergic to dates , thrush in mouth with reaction   Amoxicillin Other (See Comments), Rash   Unknown reaction. 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         Medication List     STOP taking these medications    norgestimate-ethinyl estradiol 0.25-35 MG-MCG tablet Commonly known as: ORTHO-CYCLEN       TAKE these medications    dicyclomine 20 MG tablet Commonly known as: BENTYL Take 1 tablet (20 mg total) by mouth 3 (three) times daily as needed for spasms (abdominal pain).   ibuprofen 600 MG tablet Commonly known as: ADVIL Take 1 tablet (600 mg total) by mouth every 6 (six) hours.   ondansetron 4 MG disintegrating tablet Commonly known as: Zofran ODT Take 1 tablet (4 mg total) by mouth every 8 (eight) hours as needed for nausea or vomiting.   pantoprazole 20 MG tablet Commonly known as: PROTONIX Take 1 tablet (20 mg total) by mouth daily.   QC TUMERIC COMPLEX PO Take by mouth.         Discharge home in stable condition Infant Feeding:  Breast Infant Disposition:home with mother Discharge instruction: per After Visit Summary and Postpartum booklet. Activity: Advance as tolerated. Pelvic rest for 6 weeks.  Diet: routine diet Postpartum Appointment:6 weeks Follow up Visit:  Follow-up Information     [facility]. Schedule an appointment as soon as possible for a visit in 6 week(s).   Contact information: [address]  [address] [place] [address] [phone]                     04/27/2023 Zenaida Niece, MD

## 2023-04-27 NOTE — Discharge Instructions (Signed)
As per discharge pamphlet °

## 2023-04-27 NOTE — Progress Notes (Signed)
PPD #2 Doing well, some cramps Afeb, VSS D/c home

## 2023-05-04 ENCOUNTER — Telehealth (HOSPITAL_COMMUNITY): Payer: Self-pay | Admitting: *Deleted

## 2023-05-04 NOTE — Telephone Encounter (Signed)
Patient voiced no questions or concerns regarding her health at this time. EPDS not completed. Patient stated, "We can repeat it (EPDS), but nothing has changed. I"m doing well." Patient voiced no questions or concerns regarding infant at this time. RN reviewed ABCs of safe sleep. Patient verbalized understanding. Patient requested RN email information on hospital's postpartum classes and support groups. Email sent. Deforest Hoyles, RN, 05/04/23, [phone]

## 2023-05-07 ENCOUNTER — Other Ambulatory Visit (HOSPITAL_COMMUNITY): Payer: Self-pay

## 2023-05-08 ENCOUNTER — Inpatient Hospital Stay (HOSPITAL_COMMUNITY): Payer: Medicaid Other

## 2023-05-08 ENCOUNTER — Inpatient Hospital Stay (HOSPITAL_COMMUNITY)
Admission: AD | Admit: 2023-05-08 | Payer: Medicaid Other | Source: Home / Self Care | Admitting: Obstetrics and Gynecology

## 2024-03-11 ENCOUNTER — Ambulatory Visit
Admission: EM | Admit: 2024-03-11 | Discharge: 2024-03-11 | Disposition: A | Discharge status: Home / Self Care | Attending: Family Medicine | Admitting: Family Medicine

## 2024-03-11 ENCOUNTER — Other Ambulatory Visit: Payer: Self-pay

## 2024-03-11 ENCOUNTER — Encounter: Payer: Self-pay | Admitting: *Deleted

## 2024-03-11 DIAGNOSIS — M549 Dorsalgia, unspecified: Secondary | ICD-10-CM | POA: Diagnosis not present

## 2024-03-11 LAB — POCT URINALYSIS DIP (MANUAL ENTRY)
Bilirubin, UA: NEGATIVE
Blood, UA: NEGATIVE
Glucose, UA: NEGATIVE mg/dL
Ketones, POC UA: NEGATIVE mg/dL
Leukocytes, UA: NEGATIVE
Nitrite, UA: NEGATIVE
Protein Ur, POC: NEGATIVE mg/dL
Spec Grav, UA: 1.02 (ref 1.010–1.025)
Urobilinogen, UA: 0.2 U/dL
pH, UA: 5.5 (ref 5.0–8.0)

## 2024-03-11 LAB — POCT URINE PREGNANCY: Preg Test, Ur: NEGATIVE

## 2024-03-11 MED ORDER — NAPROXEN 375 MG PO TABS
375.0000 mg | ORAL_TABLET | Freq: Two times a day (BID) | ORAL | 0 refills | Status: AC | PRN
Start: 2024-03-11 — End: ?

## 2024-03-11 NOTE — ED Triage Notes (Addendum)
 PT reports she only has one Kidney ,but unsure which side. Pt woke up with back pain on Thursday and is worried she has a kidney stone. Pt has never had a stone before this. PT reports back pain is on the RT and is worse when pressure is applied to RT side.

## 2024-03-11 NOTE — ED Provider Notes (Signed)
 [facility]    CSN: [id number] Arrival date & time: 03/11/24  1244      History   Chief Complaint Chief Complaint  Patient presents with   Back Pain    HPI Carol Walters is a 28 y.o. female presents for back pain.  Patient reports Thursday she woke with a right flank/mid back pain that occurs only with certain movements or stretching.  Denies any injury or known inciting event.  Pain does not radiate.  Denies any dysuria, hematuria, numbness/tingling/weakness of her lower extremities.  No bowel or bladder incontinence or saddle paresthesia.  No history of back pain/surgeries.  She does have a history of a atrophied right kidney secondary to vascular concerns early in life but does have a functioning left kidney.  She has not tried any over-the-counter medicines for her symptoms.  No other concerns at this time.   Back Pain   Past Medical History:  Diagnosis Date   Bacterial vaginosis - recurrent BV 09/27/2019   Chronic kidney disease    Near syncope 09/27/2019   Negative cardiac workup    Patient Active Problem List   Diagnosis Date Noted   Normal labor 04/24/2023   Renal atrophy, right 07/28/2021   Adrenal mass, right (HCC) 07/28/2021   Oral contraceptive use 09/27/2019   Bacterial vaginosis - recurrent BV 09/27/2019   Near syncope 09/27/2019    Past Surgical History:  Procedure Laterality Date   NO PAST SURGERIES      OB History     Gravida  1   Para  1   Term  1   Preterm      AB      Living  1      SAB      IAB      Ectopic      Multiple  0   Live Births  1            Home Medications    Prior to Admission medications   Medication Sig Start Date End Date Taking? Authorizing Provider  naproxen (NAPROSYN) 375 MG tablet Take 1 tablet (375 mg total) by mouth 2 (two) times daily as needed (back pain). 03/11/24  Yes Radford Pax, NP  dicyclomine (BENTYL) 20 MG tablet Take 1 tablet (20 mg total) by mouth 3 (three) times daily  as needed for spasms (abdominal pain). 07/23/21   Alvira Monday, MD  ibuprofen (ADVIL) 600 MG tablet Take 1 tablet (600 mg total) by mouth every 6 (six) hours. 04/27/23   Meisinger, Tawanna Cooler, MD  ondansetron (ZOFRAN ODT) 4 MG disintegrating tablet Take 1 tablet (4 mg total) by mouth every 8 (eight) hours as needed for nausea or vomiting. 07/23/21   Alvira Monday, MD  pantoprazole (PROTONIX) 20 MG tablet Take 1 tablet (20 mg total) by mouth daily. 07/28/21   Willow Ora, MD  Turmeric (QC TUMERIC COMPLEX PO) Take by mouth.    [provider]    Family History Family History  Problem Relation Age of Onset   Healthy Mother    Diabetes Maternal Grandmother    Hearing loss Maternal Grandmother    Cancer Maternal Grandfather    Hearing loss Maternal Grandfather    High blood pressure Maternal Grandfather    Stroke Paternal Grandmother    High blood pressure Paternal Grandfather    Stroke Paternal Grandfather    Healthy Father     Social History Social History   Tobacco Use   Smoking  status: Never   Smokeless tobacco: Never  Substance Use Topics   Alcohol use: Yes    Comment: occ   Drug use: Never     Allergies   Food and Amoxicillin   Review of Systems Review of Systems  Musculoskeletal:  Positive for back pain.     Physical Exam Triage Vital Signs ED Triage Vitals  Encounter Vitals Group     BP 03/11/24 1350 105/74     Systolic BP Percentile --      Diastolic BP Percentile --      Pulse Rate 03/11/24 1350 81     Resp 03/11/24 1350 18     Temp 03/11/24 1350 99.2 F (37.3 C)     Temp src --      SpO2 03/11/24 1350 97 %     Weight --      Height --      Head Circumference --      Peak Flow --      Pain Score 03/11/24 1346 4     Pain Loc --      Pain Education --      Exclude from Growth Chart --    No data found.  Updated Vital Signs BP 105/74   Pulse 81   Temp 99.2 F (37.3 C)   Resp 18   LMP 02/18/2024   SpO2 97%   Visual Acuity Right Eye  Distance:   Left Eye Distance:   Bilateral Distance:    Right Eye Near:   Left Eye Near:    Bilateral Near:     Physical Exam Vitals and nursing note reviewed.  Constitutional:      Appearance: Normal appearance.  HENT:     Head: Normocephalic and atraumatic.  Eyes:     Pupils: Pupils are equal, round, and reactive to light.  Cardiovascular:     Rate and Rhythm: Normal rate.  Pulmonary:     Effort: Pulmonary effort is normal.  Abdominal:     Tenderness: There is no right CVA tenderness or left CVA tenderness.  Musculoskeletal:     Thoracic back: Tenderness present. No swelling, deformity, lacerations, spasms or bony tenderness. Normal range of motion. No scoliosis.     Lumbar back: Normal.       Back:     Comments: There is tenderness with palpation to the lateral right thoracic back only when patient arches towards the left.  If patient is sitting straight and not moving there is no tenderness with palpation.  No CVAT.  Skin:    General: Skin is warm and dry.  Neurological:     General: No focal deficit present.     Mental Status: She is alert and oriented to person, place, and time.  Psychiatric:        Mood and Affect: Mood normal.        Behavior: Behavior normal.      UC Treatments / Results  Labs (all labs ordered are listed, but only abnormal results are displayed) Labs Reviewed  POCT URINE PREGNANCY  POCT URINALYSIS DIP (MANUAL ENTRY)    EKG   Radiology No results found.  Procedures Procedures (including critical care time)  Medications Ordered in UC Medications - No data to display  Initial Impression / Assessment and Plan / UC Course  I have reviewed the triage vital signs and the nursing notes.  Pertinent labs & imaging results that were available during my care of the patient were reviewed by me and  considered in my medical decision making (see chart for details).     Reviewed exam and symptoms with patient.  No red flags.  UA and urine  hCG negative.  Discussed musculoskeletal cause of pain.  Will do trial of naproxen twice daily as needed.  Discussed heat and rest.  PCP follow-up if symptoms do not improve.  ER precautions reviewed and patient verbalized understanding. Final Clinical Impressions(s) / UC Diagnoses   Final diagnoses:  Musculoskeletal back pain     Discharge Instructions      Restart naproxen twice daily as needed for your back pain.  Heat to the back and rest.  Follow-up with your PCP if your symptoms do not improve.  Please go to the ER for any worsening symptoms.  Hope you feel better soon!     ED Prescriptions     Medication Sig Dispense Auth. Provider   naproxen (NAPROSYN) 375 MG tablet Take 1 tablet (375 mg total) by mouth 2 (two) times daily as needed (back pain). 14 tablet Radford Pax, NP      PDMP not reviewed this encounter.   Radford Pax, NP 03/11/24 [phone]

## 2024-03-11 NOTE — Discharge Instructions (Signed)
 Restart naproxen twice daily as needed for your back pain.  Heat to the back and rest.  Follow-up with your PCP if your symptoms do not improve.  Please go to the ER for any worsening symptoms.  Hope you feel better soon!
